# Patient Record
Sex: Female | Born: 1935 | Race: White | Hispanic: No | Smoking: Never smoker
Health system: Southern US, Community
[De-identification: ages and names within clinical notes are randomized; demographics above are authoritative.]

## PROBLEM LIST (undated history)

## (undated) DIAGNOSIS — J189 Pneumonia, unspecified organism: Secondary | ICD-10-CM

## (undated) DIAGNOSIS — K219 Gastro-esophageal reflux disease without esophagitis: Secondary | ICD-10-CM

## (undated) DIAGNOSIS — E785 Hyperlipidemia, unspecified: Secondary | ICD-10-CM

## (undated) HISTORY — DX: Hyperlipidemia, unspecified: E78.5

## (undated) HISTORY — PX: APPENDECTOMY: SHX54

## (undated) HISTORY — DX: Gastro-esophageal reflux disease without esophagitis: K21.9

---

## 1998-02-15 ENCOUNTER — Inpatient Hospital Stay (HOSPITAL_COMMUNITY): Admission: EM | Admit: 1998-02-15 | Discharge: 1998-02-19 | Payer: Self-pay | Admitting: Cardiology

## 2000-06-28 ENCOUNTER — Emergency Department (HOSPITAL_COMMUNITY): Admission: EM | Admit: 2000-06-28 | Discharge: 2000-06-28 | Payer: Self-pay | Admitting: *Deleted

## 2000-06-28 ENCOUNTER — Encounter: Payer: Self-pay | Admitting: *Deleted

## 2001-01-12 ENCOUNTER — Ambulatory Visit (HOSPITAL_COMMUNITY): Admission: RE | Admit: 2001-01-12 | Discharge: 2001-01-12 | Payer: Self-pay | Admitting: Family Medicine

## 2001-01-12 ENCOUNTER — Encounter: Payer: Self-pay | Admitting: Family Medicine

## 2001-06-03 ENCOUNTER — Ambulatory Visit (HOSPITAL_COMMUNITY): Admission: RE | Admit: 2001-06-03 | Discharge: 2001-06-03 | Payer: Self-pay | Admitting: Family Medicine

## 2001-06-03 ENCOUNTER — Encounter: Payer: Self-pay | Admitting: Family Medicine

## 2001-06-30 ENCOUNTER — Ambulatory Visit (HOSPITAL_COMMUNITY): Admission: RE | Admit: 2001-06-30 | Discharge: 2001-06-30 | Payer: Self-pay | Admitting: Internal Medicine

## 2001-12-20 ENCOUNTER — Ambulatory Visit: Admission: RE | Admit: 2001-12-20 | Discharge: 2001-12-20 | Payer: Self-pay | Admitting: Family Medicine

## 2002-01-16 ENCOUNTER — Encounter: Payer: Self-pay | Admitting: Family Medicine

## 2002-01-16 ENCOUNTER — Ambulatory Visit (HOSPITAL_COMMUNITY): Admission: RE | Admit: 2002-01-16 | Discharge: 2002-01-16 | Payer: Self-pay | Admitting: Family Medicine

## 2003-04-10 ENCOUNTER — Ambulatory Visit (HOSPITAL_COMMUNITY): Admission: RE | Admit: 2003-04-10 | Discharge: 2003-04-10 | Payer: Self-pay | Admitting: Family Medicine

## 2004-09-04 ENCOUNTER — Ambulatory Visit (HOSPITAL_COMMUNITY): Admission: RE | Admit: 2004-09-04 | Discharge: 2004-09-04 | Payer: Self-pay | Admitting: Family Medicine

## 2004-10-07 ENCOUNTER — Ambulatory Visit (HOSPITAL_COMMUNITY): Admission: RE | Admit: 2004-10-07 | Discharge: 2004-10-07 | Payer: Self-pay | Admitting: Family Medicine

## 2004-10-16 ENCOUNTER — Ambulatory Visit (HOSPITAL_COMMUNITY): Admission: RE | Admit: 2004-10-16 | Discharge: 2004-10-16 | Payer: Self-pay | Admitting: Family Medicine

## 2005-01-28 ENCOUNTER — Ambulatory Visit (HOSPITAL_COMMUNITY): Admission: RE | Admit: 2005-01-28 | Discharge: 2005-01-28 | Payer: Self-pay | Admitting: Family Medicine

## 2005-02-19 ENCOUNTER — Emergency Department (HOSPITAL_COMMUNITY): Admission: EM | Admit: 2005-02-19 | Discharge: 2005-02-19 | Payer: Self-pay | Admitting: Emergency Medicine

## 2005-03-02 ENCOUNTER — Ambulatory Visit: Payer: Self-pay | Admitting: Orthopedic Surgery

## 2005-04-13 ENCOUNTER — Ambulatory Visit: Payer: Self-pay | Admitting: Orthopedic Surgery

## 2005-04-20 ENCOUNTER — Encounter (HOSPITAL_COMMUNITY): Admission: RE | Admit: 2005-04-20 | Discharge: 2005-05-20 | Payer: Self-pay | Admitting: Orthopedic Surgery

## 2005-09-18 ENCOUNTER — Inpatient Hospital Stay (HOSPITAL_COMMUNITY): Admission: AD | Admit: 2005-09-18 | Discharge: 2005-09-18 | Payer: Self-pay | Admitting: Family Medicine

## 2005-09-24 ENCOUNTER — Ambulatory Visit: Payer: Self-pay | Admitting: Internal Medicine

## 2005-10-01 ENCOUNTER — Ambulatory Visit (HOSPITAL_COMMUNITY): Admission: RE | Admit: 2005-10-01 | Discharge: 2005-10-01 | Payer: Self-pay | Admitting: Internal Medicine

## 2005-10-01 ENCOUNTER — Ambulatory Visit: Payer: Self-pay | Admitting: Internal Medicine

## 2005-10-01 ENCOUNTER — Encounter (INDEPENDENT_AMBULATORY_CARE_PROVIDER_SITE_OTHER): Payer: Self-pay | Admitting: Specialist

## 2005-10-06 ENCOUNTER — Ambulatory Visit (HOSPITAL_COMMUNITY): Admission: RE | Admit: 2005-10-06 | Discharge: 2005-10-06 | Payer: Self-pay | Admitting: Internal Medicine

## 2005-12-11 ENCOUNTER — Ambulatory Visit (HOSPITAL_COMMUNITY): Admission: RE | Admit: 2005-12-11 | Discharge: 2005-12-11 | Payer: Self-pay | Admitting: Family Medicine

## 2006-01-08 ENCOUNTER — Ambulatory Visit (HOSPITAL_COMMUNITY): Admission: RE | Admit: 2006-01-08 | Discharge: 2006-01-08 | Payer: Self-pay | Admitting: Family Medicine

## 2006-01-28 ENCOUNTER — Ambulatory Visit: Payer: Self-pay | Admitting: Internal Medicine

## 2006-04-22 ENCOUNTER — Inpatient Hospital Stay (HOSPITAL_COMMUNITY): Admission: EM | Admit: 2006-04-22 | Discharge: 2006-04-25 | Payer: Self-pay | Admitting: Emergency Medicine

## 2006-04-23 ENCOUNTER — Ambulatory Visit: Payer: Self-pay | Admitting: Cardiology

## 2006-05-13 ENCOUNTER — Encounter (HOSPITAL_COMMUNITY): Admission: RE | Admit: 2006-05-13 | Discharge: 2006-06-12 | Payer: Self-pay | Admitting: Cardiology

## 2006-05-13 ENCOUNTER — Ambulatory Visit: Payer: Self-pay | Admitting: Cardiology

## 2006-05-19 ENCOUNTER — Ambulatory Visit: Payer: Self-pay | Admitting: Cardiology

## 2006-10-26 ENCOUNTER — Ambulatory Visit (HOSPITAL_COMMUNITY): Admission: RE | Admit: 2006-10-26 | Discharge: 2006-10-26 | Payer: Self-pay | Admitting: Family Medicine

## 2006-12-11 ENCOUNTER — Emergency Department (HOSPITAL_COMMUNITY): Admission: EM | Admit: 2006-12-11 | Discharge: 2006-12-11 | Payer: Self-pay | Admitting: Emergency Medicine

## 2006-12-12 ENCOUNTER — Emergency Department (HOSPITAL_COMMUNITY): Admission: EM | Admit: 2006-12-12 | Discharge: 2006-12-12 | Payer: Self-pay | Admitting: Emergency Medicine

## 2008-05-25 ENCOUNTER — Ambulatory Visit (HOSPITAL_COMMUNITY): Admission: RE | Admit: 2008-05-25 | Discharge: 2008-05-25 | Payer: Self-pay | Admitting: Family Medicine

## 2008-05-30 ENCOUNTER — Encounter (HOSPITAL_COMMUNITY): Admission: RE | Admit: 2008-05-30 | Discharge: 2008-06-29 | Payer: Self-pay | Admitting: Family Medicine

## 2008-05-31 ENCOUNTER — Ambulatory Visit (HOSPITAL_COMMUNITY): Admission: RE | Admit: 2008-05-31 | Discharge: 2008-05-31 | Payer: Self-pay | Admitting: Family Medicine

## 2008-05-31 ENCOUNTER — Encounter (INDEPENDENT_AMBULATORY_CARE_PROVIDER_SITE_OTHER): Payer: Self-pay | Admitting: Family Medicine

## 2008-05-31 ENCOUNTER — Ambulatory Visit (HOSPITAL_COMMUNITY): Payer: Self-pay | Admitting: Family Medicine

## 2008-07-20 ENCOUNTER — Emergency Department (HOSPITAL_COMMUNITY): Admission: EM | Admit: 2008-07-20 | Discharge: 2008-07-20 | Payer: Self-pay | Admitting: Emergency Medicine

## 2008-07-27 ENCOUNTER — Encounter (INDEPENDENT_AMBULATORY_CARE_PROVIDER_SITE_OTHER): Payer: Self-pay | Admitting: *Deleted

## 2008-08-10 ENCOUNTER — Ambulatory Visit: Payer: Self-pay | Admitting: Cardiology

## 2008-08-10 ENCOUNTER — Telehealth (INDEPENDENT_AMBULATORY_CARE_PROVIDER_SITE_OTHER): Payer: Self-pay

## 2008-08-10 ENCOUNTER — Encounter: Payer: Self-pay | Admitting: Cardiology

## 2008-08-10 DIAGNOSIS — K219 Gastro-esophageal reflux disease without esophagitis: Secondary | ICD-10-CM

## 2008-08-10 DIAGNOSIS — I10 Essential (primary) hypertension: Secondary | ICD-10-CM | POA: Insufficient documentation

## 2008-08-10 DIAGNOSIS — R079 Chest pain, unspecified: Secondary | ICD-10-CM

## 2008-08-10 DIAGNOSIS — R0602 Shortness of breath: Secondary | ICD-10-CM

## 2008-08-10 DIAGNOSIS — E785 Hyperlipidemia, unspecified: Secondary | ICD-10-CM

## 2008-08-20 ENCOUNTER — Encounter (INDEPENDENT_AMBULATORY_CARE_PROVIDER_SITE_OTHER): Payer: Self-pay | Admitting: *Deleted

## 2008-08-29 DIAGNOSIS — K449 Diaphragmatic hernia without obstruction or gangrene: Secondary | ICD-10-CM | POA: Insufficient documentation

## 2008-08-29 DIAGNOSIS — D649 Anemia, unspecified: Secondary | ICD-10-CM

## 2008-08-29 DIAGNOSIS — K222 Esophageal obstruction: Secondary | ICD-10-CM | POA: Insufficient documentation

## 2008-08-29 DIAGNOSIS — K921 Melena: Secondary | ICD-10-CM

## 2008-08-30 ENCOUNTER — Ambulatory Visit: Payer: Self-pay | Admitting: Internal Medicine

## 2008-08-30 LAB — CONVERTED CEMR LAB
Basophils Absolute: 0.1 10*3/uL (ref 0.0–0.1)
Basophils Relative: 1 % (ref 0–1)
Eosinophils Relative: 1 % (ref 0–5)
HCT: 48.8 % — ABNORMAL HIGH (ref 36.0–46.0)
Lymphs Abs: 1.7 10*3/uL (ref 0.7–4.0)
MCV: 90.9 fL (ref 78.0–100.0)
Monocytes Absolute: 0.7 10*3/uL (ref 0.1–1.0)
Monocytes Relative: 9 % (ref 3–12)
Neutro Abs: 5.5 10*3/uL (ref 1.7–7.7)

## 2008-08-31 ENCOUNTER — Encounter: Payer: Self-pay | Admitting: Internal Medicine

## 2008-08-31 DIAGNOSIS — Z860100 Personal history of colon polyps, unspecified: Secondary | ICD-10-CM | POA: Insufficient documentation

## 2008-09-25 ENCOUNTER — Encounter: Admission: RE | Admit: 2008-09-25 | Discharge: 2008-09-25 | Payer: Self-pay | Admitting: Otolaryngology

## 2009-05-27 ENCOUNTER — Inpatient Hospital Stay (HOSPITAL_COMMUNITY): Admission: EM | Admit: 2009-05-27 | Discharge: 2009-05-29 | Payer: Self-pay | Admitting: Emergency Medicine

## 2009-05-27 ENCOUNTER — Ambulatory Visit: Payer: Self-pay | Admitting: Cardiology

## 2009-07-10 ENCOUNTER — Encounter: Payer: Self-pay | Admitting: Cardiology

## 2009-07-10 ENCOUNTER — Inpatient Hospital Stay (HOSPITAL_COMMUNITY): Admission: EM | Admit: 2009-07-10 | Discharge: 2009-07-13 | Payer: Self-pay | Admitting: Emergency Medicine

## 2009-07-10 ENCOUNTER — Ambulatory Visit: Payer: Self-pay | Admitting: Cardiology

## 2009-07-19 ENCOUNTER — Emergency Department (HOSPITAL_COMMUNITY): Admission: EM | Admit: 2009-07-19 | Discharge: 2009-07-19 | Payer: Self-pay | Admitting: Emergency Medicine

## 2009-07-30 ENCOUNTER — Ambulatory Visit: Payer: Self-pay | Admitting: Cardiovascular Disease

## 2009-07-30 DIAGNOSIS — I2510 Atherosclerotic heart disease of native coronary artery without angina pectoris: Secondary | ICD-10-CM | POA: Insufficient documentation

## 2009-07-31 ENCOUNTER — Encounter: Payer: Self-pay | Admitting: Adult Health

## 2010-01-03 ENCOUNTER — Encounter (INDEPENDENT_AMBULATORY_CARE_PROVIDER_SITE_OTHER): Payer: Self-pay | Admitting: *Deleted

## 2010-02-02 ENCOUNTER — Inpatient Hospital Stay (HOSPITAL_COMMUNITY)
Admission: EM | Admit: 2010-02-02 | Discharge: 2010-02-06 | Payer: Self-pay | Source: Home / Self Care | Attending: Family Medicine | Admitting: Family Medicine

## 2010-02-10 LAB — BASIC METABOLIC PANEL
BUN: 27 mg/dL — ABNORMAL HIGH (ref 6–23)
BUN: 19 mg/dL (ref 6–23)
CO2: 28 mEq/L (ref 19–32)
CO2: 26 mEq/L (ref 19–32)
Calcium: 9 mg/dL (ref 8.4–10.5)
Calcium: 8.8 mg/dL (ref 8.4–10.5)
Chloride: 102 mEq/L (ref 96–112)
Chloride: 106 mEq/L (ref 96–112)
Creatinine, Ser: 1.34 mg/dL — ABNORMAL HIGH (ref 0.4–1.2)
Creatinine, Ser: 1.11 mg/dL (ref 0.4–1.2)
GFR calc Af Amer: 45 mL/min — ABNORMAL LOW (ref >60)
GFR calc Af Amer: 56 mL/min — ABNORMAL LOW (ref >60)
GFR calc non Af Amer: 37 mL/min — ABNORMAL LOW (ref >60)
GFR calc non Af Amer: 46 mL/min — ABNORMAL LOW (ref >60)
Glucose, Bld: 114 mg/dL — ABNORMAL HIGH (ref 70–99)
Glucose, Bld: 89 mg/dL (ref 70–99)
Potassium: 4.3 mEq/L (ref 3.5–5.1)
Potassium: 4.4 mEq/L (ref 3.5–5.1)
Sodium: 139 mEq/L (ref 135–145)
Sodium: 141 mEq/L (ref 135–145)

## 2010-02-10 LAB — URINE CULTURE
Colony Count: NO GROWTH
Culture  Setup Time: 201201100248
Culture: NO GROWTH
Special Requests: NEGATIVE

## 2010-02-10 LAB — CULTURE, RESPIRATORY: Culture: NORMAL

## 2010-02-10 LAB — DIFFERENTIAL
Basophils Absolute: 0 10*3/uL (ref 0.0–0.1)
Basophils Absolute: 0 10*3/uL (ref 0.0–0.1)
Basophils Relative: 0 % (ref 0–1)
Basophils Relative: 0 % (ref 0–1)
Eosinophils Absolute: 0.4 10*3/uL (ref 0.0–0.7)
Eosinophils Absolute: 0.3 10*3/uL (ref 0.0–0.7)
Eosinophils Relative: 2 % (ref 0–5)
Eosinophils Relative: 3 % (ref 0–5)
Lymphocytes Relative: 9 % — ABNORMAL LOW (ref 12–46)
Lymphocytes Relative: 17 % (ref 12–46)
Lymphs Abs: 1.4 10*3/uL (ref 0.7–4.0)
Lymphs Abs: 1.6 10*3/uL (ref 0.7–4.0)
Monocytes Absolute: 0.9 10*3/uL (ref 0.1–1.0)
Monocytes Absolute: 1 10*3/uL (ref 0.1–1.0)
Monocytes Relative: 6 % (ref 3–12)
Monocytes Relative: 10 % (ref 3–12)
Neutro Abs: 13.6 10*3/uL — ABNORMAL HIGH (ref 1.7–7.7)
Neutro Abs: 6.6 10*3/uL (ref 1.7–7.7)
Neutrophils Relative %: 83 % — ABNORMAL HIGH (ref 43–77)
Neutrophils Relative %: 70 % (ref 43–77)

## 2010-02-10 LAB — POCT CARDIAC MARKERS
CKMB, poc: 2 ng/mL (ref 1.0–8.0)
CKMB, poc: 2.4 ng/mL (ref 1.0–8.0)
Myoglobin, poc: 116 ng/mL (ref 12–200)
Myoglobin, poc: 144 ng/mL (ref 12–200)
Troponin i, poc: <0.05 ng/mL (ref 0.00–0.09)
Troponin i, poc: <0.05 ng/mL (ref 0.00–0.09)

## 2010-02-10 LAB — CULTURE, RESPIRATORY W GRAM STAIN

## 2010-02-10 LAB — CBC
HCT: 47.6 % — ABNORMAL HIGH (ref 36.0–46.0)
HCT: 45.6 % (ref 36.0–46.0)
Hemoglobin: 16.2 g/dL — ABNORMAL HIGH (ref 12.0–15.0)
Hemoglobin: 15.4 g/dL — ABNORMAL HIGH (ref 12.0–15.0)
MCH: 31.8 pg (ref 26.0–34.0)
MCH: 31.5 pg (ref 26.0–34.0)
MCHC: 34 g/dL (ref 30.0–36.0)
MCHC: 33.8 g/dL (ref 30.0–36.0)
MCV: 93.5 fL (ref 78.0–100.0)
MCV: 93.3 fL (ref 78.0–100.0)
Platelets: 258 10*3/uL (ref 150–400)
Platelets: 231 10*3/uL (ref 150–400)
RBC: 5.09 MIL/uL (ref 3.87–5.11)
RBC: 4.89 MIL/uL (ref 3.87–5.11)
RDW: 12.6 % (ref 11.5–15.5)
RDW: 13 % (ref 11.5–15.5)
WBC: 16.3 10*3/uL — ABNORMAL HIGH (ref 4.0–10.5)
WBC: 9.5 10*3/uL (ref 4.0–10.5)

## 2010-02-10 LAB — BRAIN NATRIURETIC PEPTIDE: Pro B Natriuretic peptide (BNP): 48.3 pg/mL (ref 0.0–100.0)

## 2010-02-14 NOTE — Discharge Summary (Signed)
  [name], [name]              ACCOUNT NO.:  [id number]  MEDICAL RECORD NO.:  [id number]          PATIENT TYPE:  INP  LOCATION:  A315                          FACILITY:  [facility]  PHYSICIAN:  Mila Homer. Sudie Bailey, M.D.DATE OF BIRTH:  February 14, 1915  DATE OF ADMISSION:  02/02/2010 DATE OF DISCHARGE:  01/12/2012LH                              DISCHARGE SUMMARY   HISTORY OF PRESENT ILLNESS:  This 77 year old woman was admitted to the hospital with pneumonia.  She __________ five-day hospitalization extending from February 02, 2010, to February 06, 2010.  Vital signs remained stable.  Admission white cell count was 15,300 with 83% neutrophils.  The BMP showed a glucose of 114, BUN 27, and creatinine 1.34.  Recheck of the white cell count several days later was 9500 with a normal differential, and BMP showed BUN of 19 and creatinine 1.11.  Cardiac markers were normal.  BNP was 48.  Urine culture showed no growth and respiratory culture was felt to just show normal oropharyngeal flora.  Chest x-ray showed no acute cardiopulmonary abnormality and a persistent large diaphragmatic hernia.  This limited full evaluation of the left lower lobe.  She was admitted to the hospital by Dr. Ouida Sills who was on-call for me. She is put on normal saline, KVO, Rocephin 1 gram IV q.24 hours, Zithromax 500 mg IV q.24 hours, ASA 81 mg daily, bumetanide 0.5 mg every other day, lisinopril 20 mg daily, Norvasc 10 mg daily, Plavix 75 mg daily, alprazolam 0.25 mg b.i.d., and Lovenox 30 mg subcu q.24 hours.  When her renal function improved, Lovenox was increased to 40 mg subcu q.24 hours.  She is given Mucinex b.i.d. and Vicodin 5/500 for pains.  By her fourth day, the IV was discontinued to switch to a Hep-Lock.  O2 was checked on room air and it was discovered that her O2 sat was only 87% on room air.  She did well with the O2.  Arrangements were made for home health to follow her at home to check her O2 sat and  her convalescence.  She is much improved at time of discharge home on her fifth day.  FINAL DISCHARGE DIAGNOSES: 1. Presumptive pneumonia. 2. Benign essential hypertension. 3. Severe degenerative joint disease in the knees. 4. Dehydration.  HOME MEDICATIONS: 1. Ceftin 500 mg b.i.d. (20, no refills). 2. Alprazolam 0.5 mg, half tablet b.i.d. 3. Amlodipine 10 mg daily. 4. ASA 81 mg daily. 5. Clopidogrel 75 mg daily. 6. Iron OTC daily. 7. Lisinopril 10 mg daily.  FOLLOWUP:  Follow-up is to be in the office in the week following discharge.     Mila Homer. Sudie Bailey, M.D.     SDK/MEDQ  D:  02/06/2010  T:  02/06/2010  Job:  [id number]  Electronically Signed by John Giovanni M.D. on 02/14/2010 07:09:16 AM

## 2010-02-14 NOTE — Progress Notes (Signed)
  NAMEDECARLA, SIEMEN              ACCOUNT NO.:  [id number]  MEDICAL RECORD NO.:  [id number]          PATIENT TYPE:  INP  LOCATION:  A315                          FACILITY:  [facility]  PHYSICIAN:  Mila Homer. Sudie Bailey, M.D.DATE OF BIRTH:  04/10/1915  DATE OF PROCEDURE: DATE OF DISCHARGE:                                PROGRESS NOTE   SUBJECTIVE:  She is feeling better today.  OBJECTIVE:  She is up in bed, sitting up.  Color is good on O2. Temperature is 97.6, pulse 97, respiratory rate 18, blood pressure 135/82. She is well-developed, well-nourished.  No acute distress, oriented and alert today. LUNGS:  Show occasional inspiratory and expiratory rhonchi throughout, but she is moving air well.  There are no intercostal retractions.  No use of accessory muscles of respiration. HEART:  Has a regular rhythm, rate about 90.  There is no edema of the ankles. O2 saturations 94% on 2 liters.  ASSESSMENT: 1. Pneumonia. 2. Benign essential hypertension.  PLAN:  I talked to discharge planning and the patient's personal nurse. Will get physical therapy to make sure that she can do transfers and ambulation with assistance today.  DC the IV switch to Hep-Lock. Continue IV antibiotics.  Arrange for home health if needed and plan for discharge tomorrow.     Mila Homer. Sudie Bailey, M.D.     SDK/MEDQ  D:  02/05/2010  T:  02/05/2010  Job:  [id number]  Electronically Signed by John Giovanni M.D. on 02/14/2010 07:09:31 AM

## 2010-02-14 NOTE — Progress Notes (Signed)
  NAMEHARLEE, [name]              ACCOUNT NO.:  [id number]  MEDICAL RECORD NO.:  [id number]          PATIENT TYPE:  INP  LOCATION:  A315                          FACILITY:  [facility]  PHYSICIAN:  Mila Homer. Sudie Bailey, M.D.DATE OF BIRTH:  03/21/1915  DATE OF PROCEDURE:  02/04/2010 DATE OF DISCHARGE:                                PROGRESS NOTE   SUBJECTIVE:  Cough is somewhat better but she is having some abdominal back pain from all the coughing she did yesterday.  Nursing feels she is improved, is actually coughing up some sputum now.  OBJECTIVE:  VITAL SIGNS:  Temperature is 98.2, pulse 83, respiratory rate 17, blood pressure 108/67. GENERAL:  She is somewhat recumbent in bed.  Her color is good on O2. She is in no acute distress, well-developed and obese. LUNGS:  Decreased breath sounds throughout but she is moving air well without intercostal retraction, without use of accessory muscles for respiration. HEART:  A regular rhythm, rate of 80. ABDOMEN:  Soft without organomegaly or mass, was mildly tender. EXTREMITIES:  There is no edema of the ankles.  LABORATORY DATA:  White cell count today is 9500 which is dropped from admission count 16,000.  She has 70% neutrophils.  Hemoglobin 15.4 and BMP is normal.  ASSESSMENT: 1. Presumptive pneumonia. 2. Benign essential hypertension.  PLAN:  Add Vicodin 5/500 q.4 h for pain.  Add Mucinex t.i.d.  Continue IV Rocephin and Zithromax.  She should be ready for discharge in 1-2 days.     Mila Homer. Sudie Bailey, M.D.     SDK/MEDQ  D:  02/04/2010  T:  02/04/2010  Job:  [id number]  Electronically Signed by John Giovanni M.D. on 02/14/2010 04:54:09 AM

## 2010-02-14 NOTE — Progress Notes (Signed)
  [name], [name]              ACCOUNT NO.:  [id number]  MEDICAL RECORD NO.:  [id number]          PATIENT TYPE:  INP  LOCATION:  A315                          FACILITY:  [facility]  PHYSICIAN:  Mila Homer. Sudie Bailey, M.D.DATE OF BIRTH:  [birth year]  DATE OF PROCEDURE:  02/03/2010 DATE OF DISCHARGE:                                PROGRESS NOTE   SUBJECTIVE:  The patient was admitted to the hospital last night after having symptoms of a "bad chest cold" for a day.  She had been coughing up thick yellow sputum.  Nursing has also noted she has a strong odor to her urine.  OBJECTIVE:  VITAL SIGNS:  Temperature is 97.5, pulse 85, respiratory rate 16, blood pressure 156/82.  GENERAL:  She is sitting up in bed with O2 on at 2 liters.  Color is good.  She is well-developed, well- nourished, oriented and alert.  Her speech is normal. LUNGS:  Clear throughout moving air well without intercostal retraction without use of accessory muscles of respiration. HEART:  Regular rhythm rate of about 80 with a 1/6 SEM. EXTREMITIES:  There is trace to 1+ pitting edema of the distal legs and ankles.  LABORATORY DATA:  Admission WBC was 16,300 with a shift to the left, BUN 27, creatinine 1.34.  ASSESSMENT: 1. Presumptive pneumonia 2. Benign essential hypertension. 3. Severe degenerative joint disease in knees. 4. Dehydration. 5. Question urinary tract infection.  PLAN: 1. Sputum for gram stain C and S, urine for UA and C and S.  Repeat a     CBC and BMP in the a.m. 2. Continue O2 and IV antibiotics. 3. Currently on Zithromax 500 mg IV q.24 h. 4. Rocephin 1 gm IV q.24 h. 5. Continue amlodipine 10 mg daily. 6. Bumetanide 0.5 mg daily. 7. Plavix 75 mg daily. 8. Lisinopril 20 mg daily. 9. Continue ASA 81 mg daily. 10.She also has alprazolam 0.25 mg b.i.d.  I suspect it will be 3-4 days before she is ready for discharge home given age and constitution.     Mila Homer. Sudie Bailey,  M.D.     SDK/MEDQ  D:  02/03/2010  T:  02/03/2010  Job:  [id number]  Electronically Signed by John Giovanni M.D. on 02/14/2010 62:83:15 AM

## 2010-02-16 ENCOUNTER — Encounter: Payer: Self-pay | Admitting: Cardiology

## 2010-02-16 ENCOUNTER — Encounter: Payer: Self-pay | Admitting: Family Medicine

## 2010-02-25 NOTE — Consult Note (Signed)
Summary: [facility] Consult-Dr. Diona Browner  [facility]   Imported By: Roderic Ovens 08/10/2009 12:03:01  _____________________________________________________________________  External Attachment:    Type:   Image     Comment:   External Document

## 2010-02-25 NOTE — Assessment & Plan Note (Signed)
Summary: [facility]   Visit Type:  Follow-up Referring Provider:  Sudie Bailey Primary Provider:  Dr.Stephen Sudie Bailey  CC:  no cardiology complaints.  History of Present Illness: [name] is a very pleasant 70 CF who was recently in [facility] with a NSTEMI.  She refused invasivre cardiac testing and elected to be treated medically only.  She was also diagnosed with grade II diastolic dysfunction, with EF of 55%-60%.  She has a history of HTN snf mild anxiety. She uses a wheel chair for ambulation and is very sedintary at home.  She denies any further complaints of chest discomfort, Dyspena or weakness.  She is medically compliant.  It is noted that on discharge she was started on Plavix but she does not have a Rx for this and has not been taking it. She states she was taking Plavix while in the hospital.  Current Medications (verified): 1)  Bumetanide 1 Mg Tabs (Bumetanide) .Marland Kitchen.. 1 Tab Once Daily 2)  Klor-Con M20 20 Meq Cr-Tabs (Potassium Chloride Crys Cr) .Marland Kitchen.. 1 Tab Once Daily 3)  Alprazolam 0.5 Mg Tabs (Alprazolam) .Marland Kitchen.. 1 Tab Two Times A Day 4)  Amlodipine Besylate 10 Mg Tabs (Amlodipine Besylate) .Marland Kitchen.. 1 Tab Once Daily 5)  Oscal 500/200 D-3 500-200 Mg-Unit Tabs (Calcium-Vitamin D) .Marland Kitchen.. 1 Tab Once Daily 6)  Multivitamins  Tabs (Multiple Vitamin) .... Once Daily 7)  Iron 325 (65 Fe) Mg Tabs (Ferrous Sulfate) .... As Directed 8)  Aleve 220 Mg Tabs (Naproxen Sodium) .... Use As Directed 9)  Aspir-Low 81 Mg Tbec (Aspirin) .... Take 1 Tab Daily 10)  Colcrys 0.6 Mg Tabs (Colchicine) .... Take 1 Tab Three Times A Day 11)  Lisinopril 20 Mg Tabs (Lisinopril) .... Take 1 Tab Daily 12)  Vitamin C Cr 500 Mg Cr-Caps (Ascorbic Acid) .... Take 1 Tab Daily 13)  Milk of Magnesia 400 Mg/55ml Susp (Magnesium Hydroxide) .... Use As Needed 14)  Plavix 75 Mg Tabs (Clopidogrel Bisulfate) .... Take 1 Tablet By Mouth Once A Day  Allergies (verified): No Known Drug Allergies  Past History:  Past medical, surgical, family and  social histories (including risk factors) reviewed, and no changes noted (except as noted below).  Past Medical History: Reviewed history from 08/10/2008 and no changes required. HYPERLIPIDEMIA, MILD (ICD-272.4) GASTROESOPHAGEAL REFLUX DISEASE (ICD-530.81) ESSENTIAL HYPERTENSION (ICD-401.9) CHEST PAIN (ICD-786.50)  Past Surgical History: Reviewed history from 08/08/2008 and no changes required. appendectomy  Family History: Reviewed history from 08/30/2008 and no changes required. Father:deceased due to heart issues age 48 Mother:deceased age 57 due to brain cancer Siblings one brother and one sister ( both deceased) Brother had MI,unsure about sister  Social History: Reviewed history from 08/30/2008 and no changes required. widow Tobacco Use - No.  Alcohol Use - no Regular Exercise - no Drug Use - no pt has 7 children Marital Status: Married Children:  Occupation:   Review of Horticulturist, commercial. All other systems have been reviewed and are negative unless stated above.   Vital Signs:  Patient profile:   77 year old female Weight:      188 pounds BMI:     31.40 Pulse rate:   88 / minute BP sitting:   119 / 66  (right arm)  Vitals Entered By: Dreama Saa, CNA (July 30, 2009 2:48 PM)  Physical Exam  General:  normal appearance.   Lungs:  Diminished bibasilar.  No wheezes  or rhonci. Heart:  RRR with soft systolic murmur Abdomen:  Bowel sounds positive; abdomen soft and non-tender without masses, organomegaly, or hernias noted. No hepatosplenomegaly. Msk:  Kyphosis Extremities:  trace left pedal edema and trace right pedal edema.in dependent position   Neurologic:  Alert and oriented x 3. Psych:  Normal affect.   Impression & Recommendations:  Problem # 1:  CAD, NATIVE VESSEL (ICD-414.01) [name] is without complaint at this time.  She continues to prefer med mgt.  I will restart Plavix 75mg  daily as she was Rx this on discharge but no Rx was  provided.  She is sedintary and uses a wheel chair for ambulation.  She will return in 3 months or sooner should she be symptomatic. Her updated medication list for this problem includes:    Amlodipine Besylate 10 Mg Tabs (Amlodipine besylate) .Marland Kitchen... 1 tab once daily    Aspir-low 81 Mg Tbec (Aspirin) .Marland Kitchen... Take 1 tab daily    Lisinopril 20 Mg Tabs (Lisinopril) .Marland Kitchen... Take 1 tab daily    Plavix 75 Mg Tabs (Clopidogrel bisulfate) .Marland Kitchen... Take 1 tablet by mouth once a day  Problem # 2:  HYPERTENSION (ICD-401.9) Assessment: Unchanged  The following medications were removed from the medication list:    Hydrochlorothiazide 25 Mg Tabs (Hydrochlorothiazide) ..... Once daily Her updated medication list for this problem includes:    Bumetanide 1 Mg Tabs (Bumetanide) .Marland Kitchen... 1 tab once daily    Amlodipine Besylate 10 Mg Tabs (Amlodipine besylate) .Marland Kitchen... 1 tab once daily    Aspir-low 81 Mg Tbec (Aspirin) .Marland Kitchen... Take 1 tab daily    Lisinopril 20 Mg Tabs (Lisinopril) .Marland Kitchen... Take 1 tab daily  Patient Instructions: 1)  RESTART YOUR PLAVIX 75MG  TAKING ONE DAILY.  A new prescription has been sent to your pharmacy. 2)  Your physician wants you to follow-up in:3 MONTHS. You will receive a reminder letter in the mail about two months in advance. If you don't receive a letter, please call our office to schedule the follow-up appointment. Prescriptions: PLAVIX 75 MG TABS (CLOPIDOGREL BISULFATE) Take 1 tablet by mouth once a day  #30 x 6   Entered by:   Carlye Grippe   Authorized by:   Joni Reining, NP   Signed by:   Carlye Grippe on 07/30/2009   Method used:   Electronically to        [facility] (retail)       [address]       [place], [place]  [address]       Ph: [phone] or [phone]       Fax: [phone]   RxID:   [id number]   Prevention & Chronic Care Immunizations   Influenza vaccine: Not documented    Tetanus booster: Not documented    Pneumococcal  vaccine: Not documented    H. zoster vaccine: Not documented  Colorectal Screening   Hemoccult: Not documented    Colonoscopy: Not documented  Other Screening   Pap smear: Not documented    Mammogram: Not documented    DXA bone density scan: Not documented   Smoking status: never  (08/08/2008)  Lipids   Total Cholesterol: Not documented   LDL: Not documented   LDL Direct: Not documented   HDL: Not documented   Triglycerides: Not documented    SGOT (AST): Not documented   SGPT (ALT): Not documented   Alkaline phosphatase: Not documented   Total bilirubin: Not documented  Hypertension   Last Blood Pressure: 119 / 66  (07/30/2009)  Serum creatinine: Not documented   Serum potassium Not documented  Self-Management Support :    Hypertension self-management support: Not documented    Lipid self-management support: Not documented

## 2010-02-25 NOTE — Letter (Signed)
Summary: Appointment - Reminder 2  [facility] [address]   [place], [place] [address]   Phone: [phone]  Fax: [phone]     January 03, 2010 MRN: [id number]   [name] [address] [place], [place]  [address]   Dear [name],  Our records indicate that it is time to schedule a follow-up appointment.  Dr.    Dietrich Pates      recommended that you follow up with [place] in   10.2011 PAST DUE         . It is very important that we reach you to schedule this appointment. We look forward to participating in your health care needs. Please contact us at the number listed above at your earliest convenience to schedule your appointment.  If you are unable to make an appointment at this time, give [place] a call so we can update our records.     Sincerely,   Glass blower/designer

## 2010-04-13 LAB — CBC
HCT: 38.8 % (ref 36.0–46.0)
HCT: 40 % (ref 36.0–46.0)
Hemoglobin: 13.3 g/dL (ref 12.0–15.0)
Hemoglobin: 13.3 g/dL (ref 12.0–15.0)
MCV: 89.7 fL (ref 78.0–100.0)
MCV: 90.2 fL (ref 78.0–100.0)
Platelets: 247 10*3/uL (ref 150–400)
Platelets: 249 10*3/uL (ref 150–400)
RBC: 4.32 MIL/uL (ref 3.87–5.11)
WBC: 7.5 10*3/uL (ref 4.0–10.5)
WBC: 8.8 10*3/uL (ref 4.0–10.5)

## 2010-04-13 LAB — RENAL FUNCTION PANEL
BUN: 13 mg/dL (ref 6–23)
CO2: 27 mEq/L (ref 19–32)
Calcium: 8.3 mg/dL — ABNORMAL LOW (ref 8.4–10.5)
Glucose, Bld: 102 mg/dL — ABNORMAL HIGH (ref 70–99)
Sodium: 138 mEq/L (ref 135–145)

## 2010-04-13 LAB — POCT CARDIAC MARKERS
Myoglobin, poc: 162 ng/mL (ref 12–200)
Troponin i, poc: <0.05 ng/mL (ref 0.00–0.09)

## 2010-04-13 LAB — DIFFERENTIAL
Eosinophils Absolute: 0.1 10*3/uL (ref 0.0–0.7)
Eosinophils Relative: 1 % (ref 0–5)
Eosinophils Relative: 2 % (ref 0–5)
Lymphocytes Relative: 15 % (ref 12–46)
Lymphs Abs: 1.5 10*3/uL (ref 0.7–4.0)
Monocytes Absolute: 0.9 10*3/uL (ref 0.1–1.0)
Monocytes Relative: 10 % (ref 3–12)
Neutro Abs: 8.4 10*3/uL — ABNORMAL HIGH (ref 1.7–7.7)

## 2010-04-13 LAB — BASIC METABOLIC PANEL
Calcium: 8.3 mg/dL — ABNORMAL LOW (ref 8.4–10.5)
Chloride: 102 mEq/L (ref 96–112)
Chloride: 105 mEq/L (ref 96–112)
Creatinine, Ser: 1.39 mg/dL — ABNORMAL HIGH (ref 0.4–1.2)
GFR calc Af Amer: 43 mL/min — ABNORMAL LOW (ref >60)
GFR calc Af Amer: 60 mL/min — ABNORMAL LOW (ref >60)
Potassium: 3.9 mEq/L (ref 3.5–5.1)
Potassium: 4 mEq/L (ref 3.5–5.1)
Sodium: 139 mEq/L (ref 135–145)

## 2010-04-13 LAB — PROTIME-INR: INR: 0.91 (ref 0.00–1.49)

## 2010-04-13 LAB — CARDIAC PANEL(CRET KIN+CKTOT+MB+TROPI)
CK, MB: 2.6 ng/mL (ref 0.3–4.0)
Relative Index: 27 — ABNORMAL HIGH (ref 0.0–2.5)
Troponin I: 10.67 ng/mL (ref 0.00–0.06)

## 2010-04-14 LAB — DIFFERENTIAL
Basophils Absolute: 0 10*3/uL (ref 0.0–0.1)
Basophils Relative: 0 % (ref 0–1)
Monocytes Relative: 5 % (ref 3–12)
Neutro Abs: 6.3 10*3/uL (ref 1.7–7.7)
Neutrophils Relative %: 76 % (ref 43–77)

## 2010-04-14 LAB — CARDIAC PANEL(CRET KIN+CKTOT+MB+TROPI)
CK, MB: 162.6 ng/mL (ref 0.3–4.0)
Relative Index: 31.4 — ABNORMAL HIGH (ref 0.0–2.5)
Total CK: 258 U/L — ABNORMAL HIGH (ref 7–177)
Total CK: 518 U/L — ABNORMAL HIGH (ref 7–177)

## 2010-04-14 LAB — CBC
MCHC: 33.2 g/dL (ref 30.0–36.0)
Platelets: 291 10*3/uL (ref 150–400)
RBC: 5.06 MIL/uL (ref 3.87–5.11)

## 2010-04-14 LAB — POCT CARDIAC MARKERS
CKMB, poc: 2.3 ng/mL (ref 1.0–8.0)
Myoglobin, poc: 115 ng/mL (ref 12–200)

## 2010-04-14 LAB — BASIC METABOLIC PANEL
CO2: 29 mEq/L (ref 19–32)
Calcium: 8.7 mg/dL (ref 8.4–10.5)
Creatinine, Ser: 1.3 mg/dL — ABNORMAL HIGH (ref 0.4–1.2)
GFR calc Af Amer: 46 mL/min — ABNORMAL LOW (ref >60)

## 2010-04-15 LAB — POCT CARDIAC MARKERS
CKMB, poc: 1.7 ng/mL (ref 1.0–8.0)
Myoglobin, poc: 100 ng/mL (ref 12–200)

## 2010-04-15 LAB — DIFFERENTIAL
Lymphocytes Relative: 23 % (ref 12–46)
Monocytes Absolute: 0.4 10*3/uL (ref 0.1–1.0)
Monocytes Relative: 7 % (ref 3–12)
Neutro Abs: 4 10*3/uL (ref 1.7–7.7)
Neutrophils Relative %: 66 % (ref 43–77)

## 2010-04-15 LAB — TSH: TSH: 2.425 u[IU]/mL (ref 0.350–4.500)

## 2010-04-15 LAB — CBC
Hemoglobin: 15.2 g/dL — ABNORMAL HIGH (ref 12.0–15.0)
RBC: 4.92 MIL/uL (ref 3.87–5.11)
WBC: 6.1 10*3/uL (ref 4.0–10.5)

## 2010-04-15 LAB — BASIC METABOLIC PANEL
Calcium: 8.9 mg/dL (ref 8.4–10.5)
GFR calc Af Amer: 56 mL/min — ABNORMAL LOW (ref >60)
GFR calc non Af Amer: 46 mL/min — ABNORMAL LOW (ref >60)
Sodium: 140 mEq/L (ref 135–145)

## 2010-04-15 LAB — CARDIAC PANEL(CRET KIN+CKTOT+MB+TROPI)
CK, MB: 1.6 ng/mL (ref 0.3–4.0)
Total CK: 27 U/L (ref 7–177)
Troponin I: 0.01 ng/mL (ref 0.00–0.06)
Troponin I: 0.01 ng/mL (ref 0.00–0.06)

## 2010-04-15 LAB — BRAIN NATRIURETIC PEPTIDE: Pro B Natriuretic peptide (BNP): <30 pg/mL (ref 0.0–100.0)

## 2010-05-05 LAB — DIFFERENTIAL
Basophils Relative: 1 % (ref 0–1)
Eosinophils Absolute: 0.1 10*3/uL (ref 0.0–0.7)
Monocytes Absolute: 0.6 10*3/uL (ref 0.1–1.0)
Neutro Abs: 5 10*3/uL (ref 1.7–7.7)
Neutrophils Relative %: 68 % (ref 43–77)

## 2010-05-05 LAB — URINALYSIS, ROUTINE W REFLEX MICROSCOPIC
Bilirubin Urine: NEGATIVE
Glucose, UA: NEGATIVE mg/dL
Hgb urine dipstick: NEGATIVE
Ketones, ur: NEGATIVE mg/dL
Protein, ur: NEGATIVE mg/dL

## 2010-05-05 LAB — BASIC METABOLIC PANEL
CO2: 34 mEq/L — ABNORMAL HIGH (ref 19–32)
Chloride: 94 mEq/L — ABNORMAL LOW (ref 96–112)
Creatinine, Ser: 1.19 mg/dL (ref 0.4–1.2)
GFR calc Af Amer: 51 mL/min — ABNORMAL LOW (ref >60)
Glucose, Bld: 87 mg/dL (ref 70–99)

## 2010-05-05 LAB — CBC
Hemoglobin: 13.8 g/dL (ref 12.0–15.0)
MCHC: 33.3 g/dL (ref 30.0–36.0)
MCV: 83.6 fL (ref 78.0–100.0)
RBC: 4.98 MIL/uL (ref 3.87–5.11)

## 2010-05-06 LAB — CROSSMATCH: Antibody Screen: NEGATIVE

## 2010-05-06 LAB — HEMOGLOBIN AND HEMATOCRIT, BLOOD
HCT: 27 % — ABNORMAL LOW (ref 36.0–46.0)
Hemoglobin: 8.4 g/dL — ABNORMAL LOW (ref 12.0–15.0)

## 2010-05-16 ENCOUNTER — Other Ambulatory Visit: Payer: Self-pay

## 2010-05-16 MED ORDER — CLOPIDOGREL BISULFATE 75 MG PO TABS: 75.0000 mg | ORAL_TABLET | Freq: Every day | ORAL | Status: DC

## 2010-06-13 NOTE — Letter (Signed)
May 19, 2006    [name]. [name], M.D.  [address]  [place], [place] [address]   RE:  [name], STONES  MRN:  [id number]  /  DOB:  01-20-1916   Dear Brett Canales,   [name] returns to the office after an eight-year hiatus.  As you  know, she was recently admitted to hospital and evaluated by Dr.  Jens Som for chest discomfort.  She has a history of GERD, which was  thought to possibly represent the etiology for her symptoms.  She  underwent cardiac catheterization in January of 2000, revealing  insignificant disease in the first and second diagonals and distal LAD.  She has felt well since hospital discharge.   A pharmacologic stress nuclear study was performed a week ago.  This  study revealed normal left ventricular size and normal left ventricular  systolic function.  There was an apparent perfusion defect  inferolaterally and apically, thought to represent breast attenuation  artifact.  Perfusion was otherwise normal.   CURRENT MEDICATIONS INCLUDE:  1. Amlodipine 10 mg daily.  2. Vytorin 10/40 mg daily.  3. Pantoprazole 40 mg daily.  4. Aspirin 81 mg daily.  5. Os-Cal daily.  6. Alprazolam 0.5 mg p.r.n.   ON EXAM:  A very pleasant, overweight [age]ogenarian.  The weight is 209,  two pounds more than in April of 1999.  Blood pressure 135/85, heart  rate 90 and regular, respirations 16.  NECK:  No jugular venous distention; no carotid bruits.  LUNGS:  Decreased breath sounds at the bases; otherwise clear.  CARDIAC:  Normal first and second heart sounds; fourth heart sound  present.  EXTREMITIES:  Trace edema.   IMPRESSION:  [name] continues to have no evidence for cardiovascular  disease.  Hypertension and dyslipidemia appear to be adequately managed.  Please let me know at any time that I can offer further assistance in  the care of this very nice woman.    Sincerely,      Gerrit Friends. Dietrich Pates, MD, [facility] [place]  Electronically Signed    RMR/MedQ  DD: 05/19/2006   DT: 05/19/2006  Job #: [id number]

## 2010-06-13 NOTE — Consult Note (Signed)
NAMEJADIN, KAGEL              ACCOUNT NO.:  [id number]   MEDICAL RECORD NO.:  [id number]          PATIENT TYPE:  OBV   LOCATION:  A330                          FACILITY:  [facility]   PHYSICIAN:  R. Roetta Sessions, M.D. DATE OF BIRTH:  1915-01-31   DATE OF CONSULTATION:  09/17/2005  DATE OF DISCHARGE:                                   CONSULTATION   REASON FOR CONSULTATION:  Profound microcytic anemia.   HISTORY OF PRESENT ILLNESS:  Ms. [name] is a pleasant 77 year old  Caucasian female followed primarily by Dr. Gareth Morgan who was admitted  for observation by Dr. Renard Matter after determining that she had a profound  anemia with a hemoglobin of 5.9 and hematocrit 19.6, MCV 67.2, platelet  count 456.  She was hemoccult negative in Dr. Renard Matter' office.  She has not  had any melena or hematochezia.  She does have occasional reflux symptoms in  the setting of a large hiatal hernia.  No odynophagia, no dysphagia.  No  abdominal pain.  No hematemesis.  No weight loss.  Her appetite has been  well maintained, she has not lost any weight.  She has never had her lower  GI tract imaged, there is no family history of colorectal neoplasia.  She  underwent an evaluation by me via EGD back in 2003 for possible  gastroesophageal reflux disease with some ENT symptoms.  She was found to  have a foreshortened esophagus with a massive hiatal hernia with 2/3 of her  stomach above the diaphragmatic hiatus.  She was treated with twice a day  Nexium 4 mg.  We have recommended an ENT evaluation, but the patient and  family do not remember ever having this done.   PAST MEDICAL HISTORY:  Significant of hyperlipidemia and hypertension.  She  denies any history of coronary disease or diabetes.   PAST SURGICAL HISTORY:  Appendectomy when she was 77 years old, EGD in 2003  as outlined above, history of right wrist fracture secondary to fall treated  by Dr. Romeo Apple at the first of this year.   CURRENT  MEDICATIONS:  1. Alprazolam 0.5 mg p.o. b.i.d.  2. Klor-Con 10 mEq 2 tablets daily.  3. Vytorin 10/40 once daily.  4. HCTZ 25 mg daily.  5. Amlodipine 10 mg daily.  6. Furosemide 40 mg daily.   ALLERGIES:  None.   FAMILY HISTORY:  Negative for chronic GI or liver disease, particularly  negative for colorectal neoplasia.   SOCIAL HISTORY:  The patient is a resident of [place].  She has seven  children (seven vaginal deliveries).  No history of tobacco or alcohol, no  illicit drugs.   REVIEW OF SYSTEMS:  No chest pain.  She has had dyspnea and easy fatigue.  No fevers or chills.  No weight loss.  Otherwise, as outlined in history of  present illness.   PHYSICAL EXAMINATION:  GENERAL:  Pleasant elderly, alert, conversant 77 year old lady accompanied  by multiple family members.  VITAL SIGNS:  Temperature 97.2, pulse 90, respirations 22, blood pressure  139/69.  SKIN:  Warm and dry.  HEENT:  Conjunctivae are pale, no scleral icterus.  Oral cavity with no  lesions, she has dentures.  CHEST:  Lungs are clear to auscultation.  CARDIAC:  Regular rate and rhythm, occasional ectopic beat.  BREASTS:  Deferred.  ABDOMEN:  Obese, positive bowel sounds, soft, nontender, no appreciable  hepatosplenomegaly.  EXTREMITIES:  No edema.  RECTAL:  Exam done earlier today by Dr. Renard Matter, she was heme occult  negative in his office.   LABORATORY DATA:  White count 7.2, hemoglobin 5.9, hematocrit 19.6, MCV  67.2, platelet count 456, reticulocyte count 5.3.  Potassium 5, chloride  102, CO2 28, glucose 90, BUN 16, creatinine 1.  Albumin 0.8, alkaline phos  51, AST 17, ALT 12, albumin 3.3, calcium 8.4.   IMPRESSION:  Ms. [name] is a pleasant 77 year old lady admitted to  observation with a profound microcytic anemia.  She is slated to have  transfusion with packed red blood cells.  Aside from some occasional reflux  symptoms, she is devoid of any lower GI tract symptoms.  She is  hemoccult  negative.  Iron studies are pending.  She does take occasional non-  steroidals.  I am somewhat concerned that she has never had her lower GI  tract imaged.  She has a history of a large hiatal hernia.   RECOMMENDATIONS:  I fully agree with transfusion with packed red blood  cells, this is a high priority at this point in time.  She needs to have  diagnostic colonoscopy and possibly EGD.  As mentioned above, she is on a  regular diet.  I think we can go ahead and plan to get her transfused to a  reasonable H&H over the next 24 hours, allow her to go home, and bring her  back at the first of the week for an outpatient colonoscopy and possible  EGD.  I have asked her to refrain from taking all forms of non-steroidals  including aspirin until we get the etiology of the anemia sorted out.  I  will also add PPI in the way of Protonix 40 mg orally daily for her  gastroesophageal reflux disease/empirically.  I will make arrangements to  get an expedited outpatient endoscopic evaluation planned for the first of  next week.   I would like to thank Dr. Butch Penny for allowing me to see this nice  lady once again today.      Jonathon Bellows, M.D.  Electronically Signed     RMR/MEDQ  D:  09/17/2005  T:  09/17/2005  Job:  [id number]   cc:   Angus G. Renard Matter, MD  Fax: [phone]   Mila Homer. Sudie Bailey, M.D.  Fax: [phone]

## 2010-06-13 NOTE — Group Therapy Note (Signed)
NAMELIND, AUSLEY              ACCOUNT NO.:  [id number]   MEDICAL RECORD NO.:  [id number]          PATIENT TYPE:  INP   LOCATION:  A330                          FACILITY:  [facility]   PHYSICIAN:  Mila Homer. Sudie Bailey, M.D.DATE OF BIRTH:  07-06-1915   DATE OF PROCEDURE:  09/17/2005  DATE OF DISCHARGE:                                   PROGRESS NOTE   SUBJECTIVE:  This 77 year old woman was admitted yesterday severely anemic  with a hemoglobin 5.9.  She had two transfusions of packed rbc's last night  and this morning has been somewhat short of breath, a little chest tightness  and a cough which is nonproductive.   She tells me she has been taking aspirin and Naprosyn at home.  She denies  hematochezia or melena or abdominal pain.   OBJECTIVE:  GENERAL:  She is sitting up in bed eating breakfast currently.  Color is good.  VITAL SIGNS:  Temperature is 98 degrees, pulse 86, respiratory rate 16,  blood pressure 130/59.  HEART:  Regular rhythm, rate of 90.  No murmurs.  LUNGS:  Bibasilar rales, but otherwise are clear throughout.  She is moving  air well.  No intercostal retraction.  No use of accessory muscles of  respiration.  ABDOMEN:  Soft without tenderness.  She has 1-2+ pitting edema of the distal  legs.   Her admission H&H is 5.9/19.6 up to 7.7/24.4 after 2 units of packed rbc's.  Retic count was 5.3% which is elevated, but absolute retic count 154.2 which  is normal.  Iron level is 12.  Iron percent saturation 3%, total iron  binding capacity 477, B12 was 519 and folate was 20.  Her MCV is 67.2.   ASSESSMENT:  1. Iron deficiency anemia secondary to chronic blood loss.  2. Probable slow gastrointestinal bleeding secondary to aspirin and Aleve.  3. Degenerative joint disease.  4. Probable fluid overload.   PLAN:  Discussed with nursing and the patient.  Furosemide 40 mg IV stat has  already been given.  12-lead EKG is pending.  She is due for a third unit of  packed cells  following which she will have a repeat H&H, possibly be  discharged today with outpatient work-up as long as hemoccults are negative.  If she has hemoccult positivity will have GI do a full work-up before  discharge.      Mila Homer. Sudie Bailey, M.D.  Electronically Signed    SDK/MEDQ  D:  09/18/2005  T:  09/18/2005  Job:  [id number]

## 2010-06-13 NOTE — Op Note (Signed)
NAMETAMMYE, [name]              ACCOUNT NO.:  [id number]   MEDICAL RECORD NO.:  [id number]          PATIENT TYPE:  AMB   LOCATION:  DAY                           FACILITY:  [facility]   PHYSICIAN:  R. Roetta Sessions, M.D. DATE OF BIRTH:  [birth year]   DATE OF PROCEDURE:  10/06/2005  DATE OF DISCHARGE:  10/06/2005                                 OPERATIVE REPORT   PROCEDURE:  Small bowel Givens capsule endoscopy.   INDICATIONS FOR PROCEDURE:  The patient is an 77 year old lady who was  recently admitted for profound microcytic anemia with hemoglobin of 5.9 and  MCV 67.2.  She was transfused with 3 units of packed red blood cells  overnight and came back for outpatient workup.  She did have two trace heme  positive stools on different stool checks.  There was no melena.  No NSAID  or aspirin use.  She has a history of known gigantic hiatal hernia,  Schatzki's ring, with recurrent esophageal dysphagia.  She was started on  Protonix 40 mg daily.  On October 01, 2005, she had EGD and colonoscopy.  She was found to have a Schatzki's ring which was dilated, large hiatal  hernia, and antral erosions.  She had a long redundant colon, left sided  diverticula, and diminutive polyps in the left colon which were removed.  H.  pylori serologies were checked, I do not have those results.  Because no  significant findings were found to explain her microcytic anemia, she is  undergoing Givens small bowel capsule today.   FINDINGS:  The patient was able to swallow the capsule without any  difficulty.  The first gastric image was at 28 seconds.  The gastric transit  time was 1 hour 52 minutes, small bowel transit time was 2 hours 11 minutes  with the first cecal image at 4 hours 4 minutes.  She was noted to have  several gastric erosions which were not bleeding.  Small bowel mucosa  appeared normal.   SUMMARY AND RECOMMENDATIONS:  Several non-bleeding gastric erosions,  otherwise, normal appearing study.   The small bowel appeared normal.  No  significant findings to explain profound microcytic anemia and heme positive  stools.  The patient is to follow up with Dr. Jena Gauss for further  recommendations.      Tana Coast, P.AJonathon Bellows, M.D.  Electronically Signed    LL/MEDQ  D:  10/16/2005  T:  10/18/2005  Job:  [id number]   cc:   Mila Homer. Sudie Bailey, M.D.  Fax: [phone]

## 2010-06-13 NOTE — Discharge Summary (Signed)
NAMEJULIANAH, [name]              ACCOUNT NO.:  [id number]   MEDICAL RECORD NO.:  [id number]          PATIENT TYPE:  INP   LOCATION:  A330                          FACILITY:  [facility]   PHYSICIAN:  Mila Homer. Sudie Bailey, M.D.DATE OF BIRTH:  04/11/1915   DATE OF ADMISSION:  09/17/2005  DATE OF DISCHARGE:  08/24/2007LH                                 DISCHARGE SUMMARY   This 77 year old woman was admitted September 17, 2005, discharged September 18, 2005.  She has severe anemia with an admission hemoglobin 5.9.  After three  transfusions of packed red cells, her hemoglobin increased so she will be  discharged home.   LABORATORY DATA:  Her admission hemoglobin and hematocrit is 5.9 and 19.6,  MCV 67, platelet count 456,000, white cell count 7200.  She had a normal  differential.  Retic count was 5.3 and absolute retic count of 154.2.  Her  iron level was 12 (normal 42 to 135, total iron binding capacity was 477 and  the % saturation of iron was 3%).  B12 was 519, folate greater than 20.  Ferritin less than 1.  Her repeat H&H was 7.7 and 24.4 after two units  packed cells and after three units of cells 10.0 and 31.6.   EKG was essentially normal.   HOSPITAL COURSE:  In the hospital she was admitted for 24 hour observation,  put on a regular diet, vital signs four times daily.  Bedside commode.  Was  typed and crossed for 3 units packed cells.  Gastroenterology was called in  consult.  She was put on Protonix 40 mg by mouth daily.  She was given Lasix  40 mg IV when she became somewhat dyspneic after two units of packed cells.   After receiving Lasix, she had good urine output, dyspnea cleared and with a  third unit of blood, hemoglobin came up as above and she was felt to be  stable for discharged home particularly since hemoccult on her stool was  negative for blood.  She was set up outpatient with Dr. Karilyn Cota and Jena Gauss,  gastroenterologists, for esophagogastroduodenoscopy and TCS.   FINAL  DISCHARGE DIAGNOSES:  1. Severe anemia secondary to chronic blood loss.  2. Fluid overload.  3. Degenerative joint disease.  4. Long term use of aspirin and nonsteroidal anti-inflammatory drugs      (Aleve), which probably caused gastrointestinal bleeding.   She was counseled on stopping aspirin, Aleve, any other NSAID.  She is to  use Tylenol alone at this point for pain control for degenerative joint  disease.  Further recommendations will be made after her EGD and total  colonoscopy.      Mila Homer. Sudie Bailey, M.D.  Electronically Signed     SDK/MEDQ  D:  09/28/2005  T:  09/28/2005  Job:  [id number]

## 2010-06-13 NOTE — Discharge Summary (Signed)
[name], AYDELOTT NO.:  [id number]   MEDICAL RECORD NO.:  [id number]          PATIENT TYPE:  INP   LOCATION:  A223                          FACILITY:  [facility]   PHYSICIAN:  Melvyn Novas, MDDATE OF BIRTH:  1915/05/22   DATE OF ADMISSION:  04/22/2006  DATE OF DISCHARGE:  LH                               DISCHARGE SUMMARY   Patient is an 77 year old white female who lives alone; however, her son  lives right next to her.   A history of hypertension, acid reflux, even with some typical/atypical  chest pain.   CAT scan was done, which was nondiagnostic for pulmonary embolism.   Cardiac enzymes were negative.   EKG remained unchanged.  It was hemodynamically stable.  Blood pressure  was 144/71 throughout the hospital in sinus rhythm.   She continued to do well.  She was seen in consultation by cardiology.  A 2D echocardiogram was obtained, which revealed normal LV function.  No  evidence of any wall motion abnormalities or CHF.  Based on cardiology  opinion and clinical condition, it was felt safe to discharge her, as  there was no clinical evidence of ischemia or decompensated CHF.  She  was found to have a large hiatal hernia on chest x-ray.   Her discharge medicines, with the addition of Protonix 40 mg per day,  was written by myself.  Her other discharge medicines are Norvasc 10 mg  per day, aspirin 81 mg daily, and Xanax 0.5 mg b.i.d., which she has at  home.      Melvyn Novas, MD  Electronically Signed     RMD/MEDQ  D:  04/24/2006  T:  04/24/2006  Job:  [id number]   cc:   Mila Homer. Sudie Bailey, M.D.  Fax: [phone]

## 2010-06-13 NOTE — H&P (Signed)
[name], [name]              ACCOUNT NO.:  [id number]   MEDICAL RECORD NO.:  [id number]          PATIENT TYPE:  INP   LOCATION:  A223                          FACILITY:  [facility]   PHYSICIAN:  Mila Homer. Sudie Bailey, M.D.DATE OF BIRTH:  1915-07-04   DATE OF ADMISSION:  04/22/2006  DATE OF DISCHARGE:  LH                              HISTORY & PHYSICAL   The patient woke up last night with chest discomfort, left shoulder  pain.  She also had a cough.   Currently he is being treated at home for hypertension and current meds  brought with her include amlodipine 10 mg daily, KCl 10 mEq b.i.d.,  Vytorin 10/40 daily for her hypercholesterolemia.   She is widowed.  Her husband died about 10 years ago of long-term  sequelae of a stroke.  She has family in the area.   She has a history of a large hiatal hernia.  Has had symptoms from that,  but this does not feel like that.  She had been seen by Dr. Dietrich Pates,  cardiologist, in the past but not recently.   Really has not had GU, GI or other cardiac or pulmonary findings.   ADMISSION EXAM:  Shows a pleasant elderly woman who is oriented and  alert.  She is well-developed and obese.  Temperature is 97.5, blood  pressure 151/81, pulse 87, respiratory rate 18.  O2 sat is 100%.  HEENT:  Mucous membranes are moist.  Skin turgor is normal.  She seemed  to be a good historian.  Sentence structure is intact.  There are no  axillary, supraclavicular or anterior cervical nodes enlarged.  LUNGS:  Show slightly decreased sounds throughout, but she is moving air  well.  No intercostal retraction.  No use of accessory muscles of  respiration.  As I press on the chest she feels some of the discomfort  is right centrally and may be somewhat reproduced by the pressure.  The  heart has a regular rhythm and rate about 70.  ABDOMEN:  Soft and obese without hepatosplenomegaly or mass.  EXTREMITIES:  There is no edema of the ankles.   Her admission white cell  count is 12,500 with 68% neutrophils, 24  lymphs, H&H 14.7/4 9.9, CK-MB is 1.6, troponin less than 0.05.  Myoglobin 94.  B-met shows glucose 117, otherwise normal.  Chest x-ray  shows a large hiatal hernia, otherwise negative.  Her D-dimer is 0.59.   She has had a lung CT which was really unremarkable.   ADMISSION DIAGNOSES:  1. Probable asthmatic bronchitis.  2. Hypercholesterolemia.  3. Benign essential hypertension.  4. Obesity.   PLAN:  She will be put on IV normal saline at 75 mL an hour, given  Rocephin 1 gram IV q.24 h, Zithromax 500 mg IV q.24 h, put her on  Xopenex 1.25 mg and Atrovent neb treatments q.4 h while awake.  O2 at 2  liters a minute by nasal prongs. A 2 gram sodium diet and Lovenox 40 mg  subcutaneously daily prophylactically.  She will be continued on  amlodipine 10 mg daily as well.  We  will get Dr. Dietrich Pates, her  cardiologist, to see her consult.      Mila Homer. Sudie Bailey, M.D.  Electronically Signed     SDK/MEDQ  D:  04/22/2006  T:  04/22/2006  Job:  [id number]

## 2010-06-13 NOTE — Op Note (Signed)
[name], [name]              ACCOUNT NO.:  [id number]   MEDICAL RECORD NO.:  [id number]          PATIENT TYPE:  AMB   LOCATION:  DAY                           FACILITY:  [facility]   PHYSICIAN:  R. Roetta Sessions, M.D. DATE OF BIRTH:  1915-10-14   DATE OF PROCEDURE:  10/01/2005  DATE OF DISCHARGE:                                 OPERATIVE REPORT   PROCEDURE:  EGD with Elease Hashimoto dilation, followed by a colonoscopy with  biopsy.   INDICATIONS FOR PROCEDURE:  The patient is an 77 year old lady followed  primarily by Katharine Look, presented to Dr. Renard Matter (on call) recently.  She was found to  have a profound anemia with a hemoglobin of 5.9, MCV of  67.2, platelet count of 456.  She was admitted overnight and given 3 units  of blood.  We have seen her.  Apparently, she was heme negative and trace  heme positive on two different stool checks.  There has been no melena,  hematochezia or hematemesis.  She has a known gigantic hiatal hernia and a  Schatzki's ring.  She does have recurrent esophageal dysphagia.  We started  her on some Protonix 40 mg orally daily in the office recently.  EGD and  colonoscopy now will be done.  (She has never had a colonoscopy).  This  approach has been discussed with the patient.  The potential risks, benefits  and alternatives have been reviewed.  This approach had been discussed with  multiple family members.  There is a question of aspirin powder use recently  as well.   PROCEDURE NOTE:  O2 saturation, blood pressure, pulses, and respiration were  monitored throughout the entire procedure.  Conscious sedation,  IV Versed,  Demerol 100 mg IV in divided doses __________ anesthesia.   INSTRUMENT:  Olympus video chip system.   FINDINGS:  EGD examination to the tubular esophagus revealed a Schatzki's  ring, esophageal mucosa otherwise appeared normal.  The esophagus was  somewhat foreshortened.  The EG junction easily traversed.   Stomach:  The gastric  cavity was emptied and insufflated well with air.  Thorough examination of gastric mucosa and retroflexion of the proximal  stomach, esophagogastric junction demonstrated a gigantic hiatal hernia.  Mucosa otherwise appeared normal aside from some antral erosions.  The  pylorus was patent and easily traversed.  Examination of the bulb and the  second portion revealed no abnormalities.   THERAPY/DIAGNOSTIC MANEUVERS PERFORMED:  A 56-French Maloney dilator was  passed to full insertion.  __________ revealed the ring had been ruptured  __________ complication.  The patient tolerated the procedure well and was  prepared for colonoscopy.   FINDINGS:  Digital rectal exam revealed no abnormalities.   ENDOSCOPIC FINDINGS:  Prep was adequate.   RECTAL:  Examination of the rectal mucosa, including retroflexion of the  anal verge, revealed no abnormalities.   COLON:  The colonic mucosa was surveyed from the rectosigmoid junction to  the left transverse, right colon to the appendiceal orifice, ileocecal valve  and the cecum.  These structures were well seen and photographed for the  record.  From  this level, the scope was slowly withdrawn.  All previously  mentioned mucosal surfaces were cautiously and carefully reviewed.  The  patient had a long, capacious colon.  The patient had extensive left-sided  diverticula.  We had to change the patient's position to use external bowel  pressure to reach the cecum.  The patient had two diminutive 3-mm polyps,  one at the splenic flexure and one in the sigmoid, which were cold  biopsied/removed.  The remainder of the colonic mucosa was well seen and  appeared normal.  I spent a good 20 minutes coming out (not counting biopsy  time).  I did observe that area of the splenic flexure that during her  respiratory excursions, the colon was pinched off at this level suggesting  part of the colon is up beside the stomach in a diaphragmatic hiatus.  There  was no  evidence of colonic neoplasia or other lesion which would explain  microcytic anemia via mechanism of GI bleeding.  The patient tolerated both  procedures well and was reactive.   ENDOSCOPY IMPRESSION:  Esophagogastroduodenoscopy:  Schatzki's ring status  post diltation as described above, otherwise normal esophagus, large hiatal  hernia, antral erosions otherwise normal gastric mucosa, patent pylorus,  normal D1, D2.   COLONOSCOPY FINDINGS:  A long, capacious, redundant colon, left-sided  diverticula, normal rectum, diminutive polyps in the left colon removed as  described above.   QUERY:  1. Left colon in close proximity to the diaphragm.  2. Remainder of colonic mucosa appeared normal.   RECOMMENDATIONS:  1. She is to continue Protonix 40 mg orally daily.  2. This lady needs Given's small bowel capsule study to be arranged next      week.  3. Further recommendations to follow.  4. Check H. pylori serologies.      Jonathon Bellows, M.D.  Electronically Signed     RMR/MEDQ  D:  10/01/2005  T:  10/01/2005  Job:  [id number]   cc:   Angus G. Renard Matter, MD  Fax: [phone]   Mila Homer. Sudie Bailey, M.D.  Fax: [phone]

## 2010-06-13 NOTE — Group Therapy Note (Signed)
[name], [name]              ACCOUNT NO.:  [id number]   MEDICAL RECORD NO.:  [id number]          PATIENT TYPE:  INP   LOCATION:  A223                          FACILITY:  [facility]   PHYSICIAN:  Mila Homer. Sudie Bailey, M.D.DATE OF BIRTH:  08/28/1915   DATE OF PROCEDURE:  DATE OF DISCHARGE:                                 PROGRESS NOTE   SUBJECTIVE:  She has not any more pain since she has been in the  hospital.  Pain awoke her from sleep night before last was the most  severe she has ever had and involved the anterior chest, breast bone  area, and radiated to the left shoulder.  This lasted a 1/2 hour and  then has cleared since then.   OBJECTIVE:  She is sitting in a  recliner.  Color is good.  She is  oriented and alert.  She is well-developed and obese.  Temperature is  97.5, pulse 96, respiratory rate 16, blood pressure 159/70.  LUNGS:  Today are clear throughout, moving air well, no intercostal  retractions, no use of accessory muscle respiration.  HEART:  A regular rhythm, rate of about 90.  ABDOMEN:  Soft without hepatosplenomegaly or mass or tenderness anywhere  the abdomen, including the upper abdomen.  There is no edema of the ankles.  O2 sat is 96% on 2 liters.   ASSESSMENT:  Severe mid-chest pain, probably secondary to hiatal hernia.   PLAN:  Workup with Dr. Dietrich Pates, Cardiologist, is proceeding.  Meanwhile, continue with IV antibiotics and fluids.      Mila Homer. Sudie Bailey, M.D.  Electronically Signed     SDK/MEDQ  D:  04/23/2006  T:  04/23/2006  Job:  [id number]

## 2010-06-13 NOTE — Consult Note (Signed)
NAMEBELLAMI, FARRELLY              ACCOUNT NO.:  [id number]   MEDICAL RECORD NO.:  [id number]          PATIENT TYPE:  INP   LOCATION:  A223                          FACILITY:  [facility]   PHYSICIAN:  Madolyn Frieze. Jens Som, MD, FACCDATE OF BIRTH:  11-01-1915   DATE OF CONSULTATION:  04/23/2006  DATE OF DISCHARGE:                                 CONSULTATION   HISTORY:  Patient is an 77 year old female with past medical history of  hypertension, hyperlipidemia, hiatal hernia, gastric reflux disease whom  we were asked to evaluate for chest pain.  She has apparently been seen  by Dr. Dietrich Pates in 1999, but I do not have those records available.  She  has no prior cardiac history per her report.  She does have a chronic  dyspnea on exertion; and mild pedal edema, but there is no orthopnea,  PND, or exertional chest pain.  Yesterday morning she developed  substernal sharp chest pain with radiation to the left shoulder.  She is  unclear about the duration, but it was several hours.  It did increase  with cough; and there was no associated nausea, vomiting, shortness of  breath, or diaphoresis.  She came to the emergency room and was admitted  for possible asthmatic bronchitis.  Her workup has included an initial  set of cardiac markers that were negative.  An electrocardiogram is not  available for review.  She had a mildly elevated D-dimer and a CT of her  chest was performed.  There was a large hiatal hernia with scattered  atherosclerotic calcifications in the aorta.  Note, there was poor  contrast opacification of the pulmonary arterial system; and it was,  therefore, felt that the exam was nondiagnostic for the presence of  pulmonary emboli.  Because of her chest pain, cardiology was asked to  evaluate.  Note, she has had no further pain since she was admitted.  Her medications at present include amlodipine 10 mg p.o. daily,  Atrovent, and Xopenex.  She is also receiving Ativan, enoxaparin  40  mg  subcu daily, Rocephin 1 gram IV q.24 h. and azithromycin 500 mg IV q.24  h.   SOCIAL HISTORY:  She has no history of tobacco use; and she does not  consume alcohol.   FAMILY HISTORY:  Her father died of a myocardial infarction in his 69s,  by report; but there is no other immediate family with coronary disease,  by report.   PAST MEDICAL HISTORY:  Significant for hypertension and hyperlipidemia;  but there is no diabetes mellitus.  She has no prior cardiac disease, as  mentioned, in the HPI.  She does have a history of a hiatal hernia and  gastric reflux disease.  She has had a prior appendectomy.  There is no  other past medical history noted.   REVIEW OF SYSTEMS:  She denies any headaches, fevers, or chills.  She  has had diffuse myalgias.  She has had a cough productive of a whitish  sputum; but there is no hemoptysis.  She denies any dysphagia,  odynophagia, melena or hematochezia.  There was no  water brash.  There  is no dysuria or hematuria.  There is no rash or seizure activity.  There is no orthopnea or PND; but there is pedal edema.  There is no  claudication noted.  The remaining systems are negative.   PHYSICAL EXAM:  VITAL SIGNS:  Today shows a blood pressure of 159/70,  and her pulse is 96.  She is afebrile.  She is saturating 96% on 2  liters.  GENERAL:  She is well-developed and obese.  She is in no acute distress.  Skin is warm and dry.  She does not appear to be depressed.  There is no  peripheral clubbing.  Back is normal, although she has had a previous  skin cancer removed from her left upper shoulder.  HEENT:  Unremarkable with normal eyelids.  NECK:  Supple with a normal upstroke bilaterally.  I cannot appreciate  bruits.  There is no jugular distension and no thyromegaly is noted.  CHEST:  Shows minimal crackles at the bases bilaterally.  Her expansion  is normal.  CARDIOVASCULAR EXAM:  Reveals regular rate and rhythm with a normal S1  and S2.  There  are no murmurs, rubs, or gallops appreciated.  Her PMI  cannot be palpated.  ABDOMEN:  Her abdominal exam nontender, nondistended, positive bowel  sounds.  No hepatosplenomegaly.  No mass appreciated.  There is no  abdominal bruit.  She has 1+ femoral pulses bilaterally and no bruits.  EXTREMITIES:  Show trace to 1+ edema bilaterally.  I cannot palpate  cords; and there is a negative Homans' bilaterally.  She has 2+ dorsalis  pedis pulses bilaterally.  NEUROLOGIC EXAM:  Grossly intact.   ELECTROCARDIOGRAM:  Not available for review.   LABORATORY DATA:  Her white blood cell count on admission was 12.5 with  a hemoglobin of 14.7, and a hematocrit 44.4.  Her platelet count is 331.  Her D-dimer was elevated at 0.59.  Her sodium 138 with potassium of 4.1,  BUN and creatinine are 12 and 0.81.  Her initial enzymes were negative,  yesterday morning, with a troponin of less than 0.05.   CHEST X-RAY:  Showed a large hiatal hernia, but no acute findings were  noted.   DIAGNOSIS:  1. Chest pain.  2. Hypertension.  3. Hyperlipidemia.  4. History of hiatal hernia.  5. Gastroesophageal reflux disease.   PLAN:  Patient has been admitted with chest pain that is somewhat  atypical.  I would recommend repeating her enzymes to rule out  myocardial infarction.  I would also plan to check a baseline  electrocardiogram.  If these are unremarkable, then I would plan an  outpatient Myoview for risk stratification (note, the patient has eaten  breakfast this morning; and therefore this study cannot be performed  today).  Her pain certainly may be musculoskeletal as it increases with  cough; and she has had a mildly productive cough with a question of a  viral syndrome.  It may also be reviewed GI related as she does have a  hiatal hernia and gastroesophageal reflux disease.  I would recommend adding enteric-coated aspirin 81 mg p.o. daily; while she is ruled out  for infarct.  I would also add Protonix  40 mg p.o. daily.  We will make  further decisions once we have the results of her electrocardiogram and  her cardiac markers.  Also note that her CT was nondiagnostic.  I will  leave the issue of question pulmonary embolus, to the primary  care  service although it seems less likely.      Madolyn Frieze Jens Som, MD, [facility]  Electronically Signed     BSC/MEDQ  D:  04/23/2006  T:  04/23/2006  Job:  [id number]

## 2010-06-13 NOTE — Procedures (Signed)
NAMESUJATA, MAINES              ACCOUNT NO.:  [id number]   MEDICAL RECORD NO.:  [id number]          PATIENT TYPE:  INP   LOCATION:  A223                          FACILITY:  [facility]   PHYSICIAN:  Madolyn Frieze. Jens Som, MD, FACCDATE OF BIRTH:  1915/01/30   DATE OF PROCEDURE:  04/23/2006  DATE OF DISCHARGE:                                ECHOCARDIOGRAM   The patient is an 77 year old female who was admitted to [facility] with chest pain.  This study is performed to quantify left  ventricular function.  It is technically adequate.  The two-dimensional  measurements are as follows:  Left ventricular end-diastolic dimension  is 3.5 cm.  Left ventricular end-systolic dimension is 2.5 cm.  The  aorta is 2.8 cm and the left atrium is 3 cm.  The septum is 1.4 cm and  the posterior wall is 1.3 cm.   The overall left ventricular function is normal with an estimated  ejection fraction of 55-65%.  There are no focal wall motion  abnormalities noted.  There is mild to moderate concentric left  ventricular hypertrophy.  There is mild left atrial enlargement.  The  right atrium and right ventricle appear to be normal.  There is no  significant pericardial effusion.   The aortic valve is trileaflet and mildly sclerotic.  By Doppler, there  is no aortic stenosis, but there is trace aortic insufficiency.  There  is mild mitral annular calcification and trace mitral regurgitation.  There is trace tricuspid regurgitation.  The mitral inflow pattern is  suggestive of relaxation abnormality.   FINAL INTERPRETATION:  1. Normal left ventricular function.  2. Mild to moderate concentric left ventricular hypertrophy.  3. Mild left atrial enlargement.  4. Trace mitral, aortic and tricuspid regurgitation.  5. Relaxation abnormality.      Madolyn Frieze Jens Som, MD, [facility]  Electronically Signed     BSC/MEDQ  D:  04/23/2006  T:  04/23/2006  Job:  [id number]   cc:   Mila Homer. Sudie Bailey, M.D.  Fax:  [phone]

## 2010-06-13 NOTE — Procedures (Signed)
NAMEGIORGIA, [name]              ACCOUNT NO.:  [id number]   MEDICAL RECORD NO.:  [id number]          PATIENT TYPE:  OUT   LOCATION:  RAD                           FACILITY:  [facility]   PHYSICIAN:  Edward L. Juanetta Gosling, M.D.DATE OF BIRTH:  1915-02-22   DATE OF PROCEDURE:  DATE OF DISCHARGE:  01/08/2006                            PULMONARY FUNCTION TEST   IMPRESSION:  1. Spirometry shows a mild ventilatory defect with evidence of air      flow obstruction.  2. Lung volume determination was not able to be done.  3. DLCO is mild to moderately reduced.  4. Arterial blood gases are normal.      Edward L. Juanetta Gosling, M.D.  Electronically Signed     ELH/MEDQ  D:  01/12/2006  T:  01/13/2006  Job:  [id number]

## 2010-06-13 NOTE — Op Note (Signed)
[facility]  Patient:    [name], [name] Visit Number: [id number] MRN: [id number]          Service Type: END Location: DAY Attending Physician:  Jonathon Bellows Dictated by:   Roetta Sessions, M.D. Proc. Date: 06/30/01 Admit Date:  06/30/2001   CC:         John Giovanni, M.D.   Operative Report  PROCEDURE:  Esophagogastroduodenoscopy.  ENDOSCOPIST:  Roetta Sessions, M.D.  INDICATIONS FOR PROCEDURE:  The patient is an 77 year old lady with chronic cough and hoarseness over the past couple of months.  Prior plain films and CT scan demonstrated a large hiatal hernia.  This lady denies any typical reflux symptoms, specifically no heartburn.  She denies any dysphagia.  EGD is now being done to further evaluate her symptoms and the abnormal radiographic findings.  This approach has been discussed with [name] previously and again at the patients bedside the potential risks, benefits, and alternatives have been discussed and question answered and she is agreeable.  Please see my dictated consultation note of Jun 14, 2001 for more information.  PROCEDURE NOTE:  Oxygen saturation, blood pressure, pulse, and respirations were monitored throughout the entire procedure under conscious sedation with IV Versed and Demerol in incremental doses, cetacaine spray for topical oropharyngeal anesthesia.  INSTRUMENT:  Olympus video chip gastroscope.  FINDINGS:  Examination of the tubular esophagus revealed a foreshortened esophagus.  The esophagogastric junction was identified at approximately 30 cm from the incisors.  There was a Schatzkis ring.  The esophageal mucosa otherwise appeared entirely normal.  The EG junction was easily traversed.  Stomach - The gastric cavity was empty and insufflated well with air. Thorough examination of the gastric mucosa including a retroflex view of the esophagogastric junction demonstrated a huge hiatal hernia, with  two-thirds of the stomach above the diaphragmatic hiatus.  Please see photos.  The pylorus was patent and easily traversed.  Duodenum  - The bulb and second portion appeared normal.  THERAPEUTIC/DIAGNOSTIC MANEUVERS PERFORMED:  None.  The patient tolerated the procedure well and was reacted in endoscopy.  IMPRESSION:  1. Foreshortened esophagus.  Schatzkis ring not manipulated because the     patient is not experiencing any dysphagia.  2. Massive hiatal hernia with two-thirds of the stomach above the     diaphragmatic hiatus.  Gastric mucosa appeared normal.  Normal first and     second duodenum.  3. The patient has a big hiatal hernia.  More than likely she is experiencing     gastroesophageal reflux although she does not have any typical symptoms.     Her hoarseness and cough may be secondary to laryngopharyngeal reflux or     in part the esophagopulmonary reflex.  She really needs to have ENT     evaluation early on to make sure there is nothing else going on     contributing to her hoarseness.  RECOMMENDATIONS:  1. Will go ahead and empirically start her on a trial of aggressive acid     suppression therapy with Nexium 40 mg p.o. b.i.d.  2. Will get her in to see one of the ENT physicians locally.  4. Further recommendations to follow. Dictated by:   Roetta Sessions, M.D. Attending Physician:  Jonathon Bellows DD:  06/30/01 TD:  07/04/01 Job: [id number]

## 2010-06-13 NOTE — H&P (Signed)
[name], [name]              ACCOUNT NO.:  [id number]   MEDICAL RECORD NO.:  [id number]          PATIENT TYPE:  OBV   LOCATION:  A330                          FACILITY:  [facility]   PHYSICIAN:  Angus G. Renard Matter, MD   DATE OF BIRTH:  04/17/1915   DATE OF ADMISSION:  09/17/2005  DATE OF DISCHARGE:  LH                                HISTORY & PHYSICAL   An 77 year old white female, a patient of Dr. Michelle Nasuti, was sent to our  office today for evaluation of severe anemia.  The patient apparently had  blood work done in Dr. Michelle Nasuti office which showed a hemoglobin of 5.9  with hematocrit of 23.6, MCV 75, MCHC 25, RDW 19.5, sedimentation rate 48.  The patient was noted to be extremely pale and had been having weakness,  fatigability.  She was examined in the office, did not show any evidence of  blood in stool.  She denied any dark stools.  I discussed the situation with  Dr. Virginia Rochester, who agreed that she should be admitted to 24-hour observation for  further evaluation and transfusion.   SOCIAL HISTORY:  Patient does not smoke or drink alcohol.   FAMILY HISTORY:  Not available.   PAST MEDICAL/SURGICAL HISTORY:  Surgery:  Patient had appendectomy as a  child, has had several normal deliveries.  Medical problems include hypertension, dyslipidemia and sleep apnea.   ALLERGIES:  PATIENT HAS NO KNOWN ALLERGIES.   MEDICINES:  Patient's medication list unknown.   REVIEW OF SYSTEMS:  HEENT:  Negative.  CARDIOPULMONARY:  Patient has had a  productive cough over the past few days.  GI:  No nausea, vomiting,  diarrhea, no abdominal pain.  GU:  No dysuria, hematuria.   PHYSICAL EXAMINATION:  GENERAL:  Pale, weak-appearing white female.  VITAL SIGNS:  Blood pressure 120/80, pulse 60, respiration 18, temp 98.  HEENT:  Eyes:  PERRLA, TM negative.  Oropharynx benign with exception of  pallor.  NECK:  Supple, no JVD or thyroid abnormalities.  HEART:  Regular rhythm, no murmurs, no thyromegaly.  BREAST:  Normal masses.  LUNGS:  Clear to P&A.  ABDOMEN:  No palpable organs or masses, no organomegaly.  PELVIC:  Genitalia normal.  Uterus normal size, shape, no masses.  RECTAL:  Examination negative.  No evidence of blood, heme-occult negative.   DIAGNOSES:  1. Severe anemia, iron deficiency, undetermined etiology.      Angus G. Renard Matter, MD  Electronically Signed     AGM/MEDQ  D:  09/17/2005  T:  09/17/2005  Job:  [id number]

## 2010-09-22 ENCOUNTER — Other Ambulatory Visit: Payer: Self-pay | Admitting: *Deleted

## 2010-09-22 MED ORDER — CLOPIDOGREL BISULFATE 75 MG PO TABS: 75.0000 mg | ORAL_TABLET | Freq: Every day | ORAL | Status: DC

## 2010-10-27 ENCOUNTER — Other Ambulatory Visit: Payer: Self-pay | Admitting: *Deleted

## 2010-10-27 MED ORDER — CLOPIDOGREL BISULFATE 75 MG PO TABS: 75.0000 mg | ORAL_TABLET | Freq: Every day | ORAL | Status: DC

## 2010-11-04 LAB — URINALYSIS, ROUTINE W REFLEX MICROSCOPIC
Glucose, UA: NEGATIVE
Hgb urine dipstick: NEGATIVE
Protein, ur: 30 — AB

## 2010-11-04 LAB — DIFFERENTIAL
Basophils Absolute: 0.1
Basophils Relative: 1
Eosinophils Relative: 1
Lymphocytes Relative: 19
Monocytes Absolute: 0.7

## 2010-11-04 LAB — URINE MICROSCOPIC-ADD ON

## 2010-11-04 LAB — BASIC METABOLIC PANEL
BUN: 15
CO2: 25
Calcium: 8.9
Chloride: 102
Creatinine, Ser: 1.07
GFR calc non Af Amer: 48 — ABNORMAL LOW
Glucose, Bld: 148 — ABNORMAL HIGH
Potassium: 3.5
Potassium: 3.6
Sodium: 138

## 2010-11-04 LAB — CBC
HCT: 47.9 — ABNORMAL HIGH
Hemoglobin: 16.1 — ABNORMAL HIGH
MCHC: 33.7
RDW: 13

## 2010-11-26 ENCOUNTER — Other Ambulatory Visit: Payer: Self-pay | Admitting: Cardiology

## 2011-01-08 ENCOUNTER — Encounter: Payer: Self-pay | Admitting: Cardiology

## 2011-03-28 ENCOUNTER — Emergency Department (HOSPITAL_COMMUNITY)
Admission: EM | Admit: 2011-03-28 | Discharge: 2011-03-28 | Disposition: A | Discharge status: Home / Self Care | Payer: Medicare Other | Attending: Emergency Medicine | Admitting: Emergency Medicine

## 2011-03-28 ENCOUNTER — Emergency Department (HOSPITAL_COMMUNITY): Payer: Medicare Other

## 2011-03-28 ENCOUNTER — Encounter (HOSPITAL_COMMUNITY): Payer: Self-pay | Admitting: *Deleted

## 2011-03-28 DIAGNOSIS — S7000XA Contusion of unspecified hip, initial encounter: Secondary | ICD-10-CM | POA: Insufficient documentation

## 2011-03-28 DIAGNOSIS — W19XXXA Unspecified fall, initial encounter: Secondary | ICD-10-CM

## 2011-03-28 DIAGNOSIS — K219 Gastro-esophageal reflux disease without esophagitis: Secondary | ICD-10-CM | POA: Insufficient documentation

## 2011-03-28 DIAGNOSIS — E785 Hyperlipidemia, unspecified: Secondary | ICD-10-CM | POA: Insufficient documentation

## 2011-03-28 DIAGNOSIS — I10 Essential (primary) hypertension: Secondary | ICD-10-CM | POA: Insufficient documentation

## 2011-03-28 DIAGNOSIS — S46909A Unspecified injury of unspecified muscle, fascia and tendon at shoulder and upper arm level, unspecified arm, initial encounter: Secondary | ICD-10-CM | POA: Insufficient documentation

## 2011-03-28 DIAGNOSIS — W010XXA Fall on same level from slipping, tripping and stumbling without subsequent striking against object, initial encounter: Secondary | ICD-10-CM | POA: Insufficient documentation

## 2011-03-28 DIAGNOSIS — Z79899 Other long term (current) drug therapy: Secondary | ICD-10-CM | POA: Insufficient documentation

## 2011-03-28 DIAGNOSIS — Z7982 Long term (current) use of aspirin: Secondary | ICD-10-CM | POA: Insufficient documentation

## 2011-03-28 DIAGNOSIS — S4980XA Other specified injuries of shoulder and upper arm, unspecified arm, initial encounter: Secondary | ICD-10-CM | POA: Insufficient documentation

## 2011-03-28 DIAGNOSIS — M25519 Pain in unspecified shoulder: Secondary | ICD-10-CM | POA: Insufficient documentation

## 2011-03-28 DIAGNOSIS — M25559 Pain in unspecified hip: Secondary | ICD-10-CM | POA: Insufficient documentation

## 2011-03-28 MED ORDER — HYDROCODONE-ACETAMINOPHEN 5-325 MG PO TABS: 1.0000 | ORAL_TABLET | Freq: Four times a day (QID) | ORAL | Status: AC | PRN

## 2011-03-28 NOTE — ED Notes (Signed)
Helped pt get dressed again

## 2011-03-28 NOTE — ED Notes (Signed)
Pt states she got up to use the restroom, tripped & landed on her left arm. Pt complains of left arm & shoulder pain.

## 2011-03-28 NOTE — ED Provider Notes (Signed)
History     CSN: [id number]  Arrival date & time 03/28/11  4098   First MD Initiated Contact with Patient 03/28/11 0405      Chief Complaint  Patient presents with  . Fall  . Arm Injury  . Shoulder Injury    (Consider location/radiation/quality/duration/timing/severity/associated sxs/prior treatment) Patient is a 78 y.o. female presenting with fall, arm injury, and shoulder injury. The history is provided by the patient and a relative.  Fall The accident occurred less than 1 hour ago. The fall occurred while walking. She fell from a height of 3 to 5 ft. She landed on a hard floor. There was no blood loss. The point of impact was the left hip and left shoulder. The pain is present in the left hip and left shoulder. The pain is at a severity of 5/10. The pain is mild. She was ambulatory at the scene. There was no entrapment after the fall. There was no drug use involved in the accident. There was no alcohol use involved in the accident. Pertinent negatives include no visual change, no fever, no numbness, no abdominal pain, no bowel incontinence, no nausea, no vomiting, no hematuria, no headaches, no loss of consciousness and no tingling.  Arm Injury  Pertinent negatives include no chest pain, no numbness, no visual disturbance, no abdominal pain, no bowel incontinence, no nausea, no vomiting, no headaches, no neck pain, no loss of consciousness, no tingling and no cough.  Shoulder Injury Pertinent negatives include no chest pain, no abdominal pain, no headaches and no shortness of breath.   Patient brought in by son tripped on the way to the bathroom landed on hard floor with pain to left hip and left shoulder was eventually able to get back on her feet with assistance from her son and was able to ambulate. Past Medical History  Diagnosis Date  . Hyperlipidemia, mild   . GERD (gastroesophageal reflux disease)   . Essential hypertension   . Chest pain     Past Surgical History    Procedure Date  . Appendectomy     Family History  Problem Relation Age of Onset  . Brain cancer Mother   . Heart disease Father   . Heart attack Brother     History  Substance Use Topics  . Smoking status: Unknown If Ever Smoked  . Smokeless tobacco: Not on file  . Alcohol Use: No    OB History    Grav Para Term Preterm Abortions TAB SAB Ect Mult Living                  Review of Systems  Constitutional: Negative for fever and diaphoresis.  HENT: Negative for nosebleeds, congestion, neck pain and neck stiffness.   Eyes: Negative for visual disturbance.  Respiratory: Negative for cough and shortness of breath.   Cardiovascular: Negative for chest pain.  Gastrointestinal: Negative for nausea, vomiting, abdominal pain, diarrhea and bowel incontinence.  Genitourinary: Negative for dysuria and hematuria.  Musculoskeletal: Negative for back pain.  Skin: Negative for rash and wound.  Neurological: Negative for dizziness, tingling, loss of consciousness, speech difficulty, numbness and headaches.  Hematological: Does not bruise/bleed easily.    Allergies  Review of patient's allergies indicates no known allergies.  Home Medications   Current Outpatient Rx  Name Route Sig Dispense Refill  . ALPRAZOLAM 0.5 MG PO TABS Oral Take 0.5 mg by mouth 2 (two) times daily.      Marland Kitchen AMLODIPINE BESYLATE 10 MG PO TABS  Oral Take 10 mg by mouth daily.      . ASPIRIN 81 MG PO TABS Oral Take 81 mg by mouth daily.      . BUMETANIDE 1 MG PO TABS Oral Take 1 mg by mouth daily.      Marland Kitchen CALCIUM CARBONATE-VITAMIN D 500-200 MG-UNIT PO TABS Oral Take 1 tablet by mouth daily.      Marland Kitchen CLOPIDOGREL BISULFATE 75 MG PO TABS  TAKE ONE (1) TABLET BY MOUTH EVERY      DAY 30 tablet 6  . COLCHICINE 0.6 MG PO TABS Oral Take 0.6 mg by mouth 3 (three) times daily.      Marland Kitchen FERROUS SULFATE 325 (65 FE) MG PO TABS Oral Take 325 mg by mouth as directed.      Marland Kitchen HYDROCODONE-ACETAMINOPHEN 5-325 MG PO TABS Oral Take 1-2  tablets by mouth every 6 (six) hours as needed for pain. 10 tablet 0  . LISINOPRIL 20 MG PO TABS Oral Take 20 mg by mouth daily.      Marland Kitchen MAGNESIUM HYDROXIDE 400 MG/5ML PO SUSP Oral Take by mouth daily as needed.      Marland Kitchen ONE-DAILY MULTI VITAMINS PO TABS Oral Take 1 tablet by mouth daily.      Marland Kitchen NAPROXEN SODIUM 220 MG PO TABS Oral Take 220 mg by mouth as directed.      Marland Kitchen POTASSIUM CHLORIDE CRYS ER 20 MEQ PO TBCR Oral Take 20 mEq by mouth daily.      Marland Kitchen VITAMIN C 500 MG PO TABS Oral Take 500 mg by mouth daily.        BP 148/72  Pulse 77  Temp(Src) 97.8 F (36.6 C) (Oral)  Resp 17  Ht 5\' 4"  (1.626 m)  Wt 188 lb (85.276 kg)  BMI 32.27 kg/m2  SpO2 98%  Physical Exam  Nursing note and vitals reviewed. Constitutional: She is oriented to person, place, and time. She appears well-developed and well-nourished. No distress.  HENT:  Head: Normocephalic and atraumatic.  Mouth/Throat: Oropharynx is clear and moist.  Eyes: Conjunctivae and EOM are normal. Pupils are equal, round, and reactive to light.  Neck: Normal range of motion. Neck supple.  Cardiovascular: Normal rate, regular rhythm, normal heart sounds and intact distal pulses.   No murmur heard. Pulmonary/Chest: Effort normal and breath sounds normal. No respiratory distress. She has no wheezes. She has no rales. She exhibits no tenderness.  Abdominal: Soft. Bowel sounds are normal. There is no tenderness.  Musculoskeletal: Normal range of motion. She exhibits tenderness.       Mild tenderness left shoulder no deformity neurovascularly intact distally no wrist pain or hand pain on the left side mild left hip discomfort no deformity no shortening the leg no external rotation good distal neurovascular on that side.  Neurological: She is alert and oriented to person, place, and time. No cranial nerve deficit. She exhibits normal muscle tone. Coordination normal.  Skin: Skin is warm. No rash noted.    ED Course  Procedures (including critical  care time)  Labs Reviewed - No data to display Dg Hip Bilateral W/pelvis  03/28/2011  *RADIOLOGY REPORT*  Clinical Data: Status post fall; bilateral hip pain.  BILATERAL HIP WITH PELVIS - 4+ VIEW  Comparison: Pelvis radiograph performed 12/12/2006  Findings: There is no evidence of fracture or dislocation.  Both femoral heads are seated normally within their respective acetabula.  There is superior joint space narrowing at the left hip, with mild associated sclerotic change.  Both  proximal femurs appear intact.  Degenerative change is noted along the lower lumbar spine.  Minimal sclerotic change is seen at the sacroiliac joints.  The visualized bowel gas pattern is grossly unremarkable in appearance.  IMPRESSION:  1.  No evidence of fracture or dislocation. 2.  Mild degenerative change at the left hip and at the lower lumbar spine.  Original Report Authenticated By: Tonia Ghent, M.D.   Dg Shoulder Left  03/28/2011  *RADIOLOGY REPORT*  Clinical Data: Status post fall; left shoulder pain.  LEFT SHOULDER - 2+ VIEW  Comparison: None.  Findings: There is no evidence of fracture or dislocation.  The left humeral head is seated within the glenoid fossa.  Mild degenerative change is noted at the left acromioclavicular joint. No significant soft tissue abnormalities are seen.  The visualized portions of the left lung are clear.  Calcification is noted within the thoracic aorta.  IMPRESSION: No evidence of fracture or dislocation.  Original Report Authenticated By: Tonia Ghent, M.D.   Dg Humerus Left  03/28/2011  *RADIOLOGY REPORT*  Clinical Data: Status post fall; left shoulder and humerus pain.  LEFT HUMERUS - 2+ VIEW  Comparison: Chest radiograph performed 02/02/2010  Findings: The left humerus appears intact.  The left humeral head remains seated at the glenoid fossa.  No fractures are seen.  Mild degenerative change is noted at the left acromioclavicular joint. No significant soft tissue abnormalities are  characterized on radiograph.  The left elbow is incompletely characterized, but appears grossly unremarkable; no definite elbow joint effusion is seen.  IMPRESSION: No evidence of fracture or dislocation.  Original Report Authenticated By: Tonia Ghent, M.D.     1. Fall   2. Shoulder pain   3. Contusion, hip       MDM  Fall at home while trying to go to the bathroom tripped and landed on her left arm complained of left hip lift arm and shoulder pain no loss of consciousness did not hit the head. It initially could not get back up on her feet but her son was called and he helped her to her feeding she was able to walk to the car.   X-ray workup negative for any evidence of hip fracture left shoulder or left humerus fracture.         Shelda Jakes, MD 03/28/11 [phone]

## 2011-03-28 NOTE — Discharge Instructions (Signed)
X-ray workup of the hips and left shoulder and arm negative for fractures. Suspect she'll be sore and bruised for a few days. Take hydrocodone pain medicine as needed. Rest. Followup with your doctor if not improving in 2-4 days.

## 2011-03-28 NOTE — ED Notes (Signed)
Mr. Warshawsky called back and will come pick pt up

## 2011-03-28 NOTE — ED Notes (Signed)
Called pt son per her request to come pick her up.  No answer, message left

## 2011-03-28 NOTE — ED Notes (Signed)
Pt son Lulabelle Desta may be reached if needed.  His number is [phone].  At this time pt is resting, no distress

## 2011-10-24 ENCOUNTER — Emergency Department (HOSPITAL_COMMUNITY)
Admission: EM | Admit: 2011-10-24 | Discharge: 2011-10-24 | Disposition: A | Discharge status: Home / Self Care | Payer: Medicare Other | Attending: Emergency Medicine | Admitting: Emergency Medicine

## 2011-10-24 ENCOUNTER — Encounter (HOSPITAL_COMMUNITY): Payer: Self-pay

## 2011-10-24 DIAGNOSIS — Y92009 Unspecified place in unspecified non-institutional (private) residence as the place of occurrence of the external cause: Secondary | ICD-10-CM | POA: Insufficient documentation

## 2011-10-24 DIAGNOSIS — Z8249 Family history of ischemic heart disease and other diseases of the circulatory system: Secondary | ICD-10-CM | POA: Insufficient documentation

## 2011-10-24 DIAGNOSIS — I10 Essential (primary) hypertension: Secondary | ICD-10-CM | POA: Insufficient documentation

## 2011-10-24 DIAGNOSIS — W19XXXA Unspecified fall, initial encounter: Secondary | ICD-10-CM | POA: Insufficient documentation

## 2011-10-24 DIAGNOSIS — Z808 Family history of malignant neoplasm of other organs or systems: Secondary | ICD-10-CM | POA: Insufficient documentation

## 2011-10-24 DIAGNOSIS — K219 Gastro-esophageal reflux disease without esophagitis: Secondary | ICD-10-CM | POA: Insufficient documentation

## 2011-10-24 NOTE — ED Notes (Signed)
Pt states she was attempting to get out of her chair and fell on her knees

## 2011-10-24 NOTE — ED Provider Notes (Signed)
History   This chart was scribed for Tobin Chad, MD, by Frederik Pear. The patient was seen in room APA11/APA11 and the patient's care was started at 1141.    CSN: [id number]  Arrival date & time 10/24/11  1044   First MD Initiated Contact with Patient 10/24/11 1141      Chief Complaint  Patient presents with  . Fall    (Consider location/radiation/quality/duration/timing/severity/associated sxs/prior treatment) HPI Comments: [name] is a 78 y.o. female who presents to the Emergency Department complaining of  falling face down after standing up to get out of a chair in her home PTA. Pt denies any LOC or sitting for an extended period of time before falling. Pt states that this is her third fall. Pt's son reports that when he arrived at her home that she was lying face down and had been for approximately 25 minutes. Pt denies any associated pain, including hip, knee, abdominal, back and head or fever. Pt lives alone and uses a walker.  She states she has had problems when attempting to stand from low-sitting chairs.              Patient is a 78 y.o. female presenting with fall. The history is provided by the patient. No language interpreter was used.  Fall The accident occurred 1 to 2 hours ago. The fall occurred while standing. She fell from a height of 1 to 2 ft. She landed on a hard floor. There was no blood loss. The pain is at a severity of 0/10. The patient is experiencing no pain. She was not ambulatory at the scene. There was no entrapment after the fall. There was no drug use involved in the accident. There was no alcohol use involved in the accident. Pertinent negatives include no fever, no abdominal pain, no nausea, no vomiting, no headaches, no loss of consciousness and no tingling. She has tried nothing for the symptoms.    Past Medical History  Diagnosis Date  . Hyperlipidemia, mild   . GERD (gastroesophageal reflux disease)   . Essential hypertension    . Chest pain     Past Surgical History  Procedure Date  . Appendectomy     Family History  Problem Relation Age of Onset  . Brain cancer Mother   . Heart disease Father   . Heart attack Brother     History  Substance Use Topics  . Smoking status: Unknown If Ever Smoked  . Smokeless tobacco: Not on file  . Alcohol Use: No    OB History    Grav Para Term Preterm Abortions TAB SAB Ect Mult Living                  Review of Systems  Constitutional: Negative for fever.  HENT:       Pt denies any head pain.  Gastrointestinal: Negative for nausea, vomiting and abdominal pain.  Musculoskeletal: Negative for back pain.       Pt denies any hip or knee pain.  Neurological: Negative for tingling, loss of consciousness and headaches.       Pt denies LOC.  All other systems reviewed and are negative.    Allergies  Review of patient's allergies indicates no known allergies.  Home Medications   Current Outpatient Rx  Name Route Sig Dispense Refill  . ALPRAZOLAM 0.5 MG PO TABS Oral Take 0.5 mg by mouth 2 (two) times daily.      Marland Kitchen AMLODIPINE BESYLATE 10 MG  PO TABS Oral Take 10 mg by mouth daily.      . ASPIRIN 81 MG PO TABS Oral Take 81 mg by mouth daily.     . BUMETANIDE 1 MG PO TABS Oral Take 1 mg by mouth daily.      Marland Kitchen CALCIUM CARBONATE-VITAMIN D 500-200 MG-UNIT PO TABS Oral Take 1 tablet by mouth daily.      Marland Kitchen CLOPIDOGREL BISULFATE 75 MG PO TABS  TAKE ONE (1) TABLET BY MOUTH EVERY      DAY 30 tablet 6  . COLCHICINE 0.6 MG PO TABS Oral Take 0.6 mg by mouth 3 (three) times daily.      Marland Kitchen FERROUS SULFATE 325 (65 FE) MG PO TABS Oral Take 325 mg by mouth as directed.      Marland Kitchen LISINOPRIL 20 MG PO TABS Oral Take 20 mg by mouth daily.      Marland Kitchen MAGNESIUM HYDROXIDE 400 MG/5ML PO SUSP Oral Take by mouth daily as needed.      Marland Kitchen ONE-DAILY MULTI VITAMINS PO TABS Oral Take 1 tablet by mouth daily.      Marland Kitchen NAPROXEN SODIUM 220 MG PO TABS Oral Take 220 mg by mouth as directed.      Marland Kitchen  POTASSIUM CHLORIDE CRYS ER 20 MEQ PO TBCR Oral Take 20 mEq by mouth daily.      Marland Kitchen VITAMIN C 500 MG PO TABS Oral Take 500 mg by mouth daily.        BP 140/65  Pulse 71  Temp 97.8 F (36.6 C)  Resp 20  Ht 5\' 2"  (1.575 m)  SpO2 96%  Physical Exam  Nursing note and vitals reviewed. Constitutional: She is oriented to person, place, and time. She appears well-developed and well-nourished. She is active. No distress. She is not intubated.  HENT:  Head: Normocephalic and atraumatic. Head is without raccoon's eyes, without Battle's sign, without contusion, without right periorbital erythema and without left periorbital erythema. No trismus in the jaw.  Right Ear: Hearing, tympanic membrane, external ear and ear canal normal. No mastoid tenderness. No hemotympanum.  Left Ear: Hearing, tympanic membrane, external ear and ear canal normal. No mastoid tenderness. No hemotympanum.  Nose: Nose normal. No mucosal edema, rhinorrhea, nose lacerations or sinus tenderness. No epistaxis.  Mouth/Throat: Uvula is midline, oropharynx is clear and moist and mucous membranes are normal. Mucous membranes are not pale and not cyanotic. She has dentures. No uvula swelling. No oropharyngeal exudate, posterior oropharyngeal edema, posterior oropharyngeal erythema or tonsillar abscesses.  Eyes: Conjunctivae normal, EOM and lids are normal. Pupils are equal, round, and reactive to light. Right eye exhibits no discharge and no exudate. Left eye exhibits no discharge and no exudate. Right conjunctiva is not injected. Right conjunctiva has no hemorrhage. Left conjunctiva is not injected. No scleral icterus. Right eye exhibits normal extraocular motion and no nystagmus. Left eye exhibits normal extraocular motion and no nystagmus. Right pupil is round and reactive. Left pupil is round and reactive. Pupils are equal.  Neck: Trachea normal, normal range of motion, full passive range of motion without pain and phonation normal. Neck  supple. No JVD present. No tracheal tenderness, no spinous process tenderness and no muscular tenderness present. Carotid bruit is not present. No rigidity. No tracheal deviation, no edema, no erythema and normal range of motion present. No thyromegaly present.  Cardiovascular: Normal rate, regular rhythm, intact distal pulses and normal pulses.  PMI is not displaced.  Exam reveals no gallop and no decreased pulses.  Murmur heard.  Systolic murmur is present with a grade of 2/6  Pulmonary/Chest: Breath sounds normal. No accessory muscle usage or stridor. No apnea, not tachypneic and not bradypneic. She is not intubated. No respiratory distress. She has no decreased breath sounds. She has no wheezes. She has no rhonchi. She has no rales.  Abdominal: Soft. Bowel sounds are normal. She exhibits no distension. There is no hepatosplenomegaly. There is no tenderness. There is no rigidity, no rebound, no guarding, no CVA tenderness, no tenderness at McBurney's point and negative Murphy's sign.  Musculoskeletal: Normal range of motion. She exhibits no edema and no tenderness.       Intact with nl ROM X4  Neurological: She is alert and oriented to person, place, and time. No cranial nerve deficit. She exhibits normal muscle tone. Coordination normal.  Skin: Skin is warm and dry. No rash noted. No erythema. No pallor.  Psychiatric: She has a normal mood and affect. Her behavior is normal.    ED Course  Procedures (including critical care time)  DIAGNOSTIC STUDIES: Oxygen Saturation is 96% on room air, adequate by my interpretation.    COORDINATION OF CARE:  12:20- Discussed planned course of treatment with the patient, including using continue using her walker and using extreme caution, who is agreeable at this time.     Labs Reviewed - No data to display No results found.   No diagnosis found.    MDM  Pt presents s/p a mechanical fall.  She denies any pain and states that she feels fine.   Note stable VS and a nl exam.  Plan allow pt to stand and ambulate (with a walker) as she would do at home.  If she is stable on her feet, Plan discharge home.   No instability noted.  Pt comfortable, NAD.  Plan discharge home.  I personally performed the services described in this documentation, which was scribed in my presence. The recorded information has been reviewed and considered.   Tobin Chad, MD 10/24/11 1322

## 2012-03-19 ENCOUNTER — Encounter (HOSPITAL_COMMUNITY): Payer: Self-pay | Admitting: Emergency Medicine

## 2012-03-19 ENCOUNTER — Emergency Department (HOSPITAL_COMMUNITY): Payer: Medicare Other

## 2012-03-19 ENCOUNTER — Emergency Department (HOSPITAL_COMMUNITY)
Admission: EM | Admit: 2012-03-19 | Discharge: 2012-03-19 | Disposition: A | Discharge status: Home / Self Care | Payer: Medicare Other | Attending: Emergency Medicine | Admitting: Emergency Medicine

## 2012-03-19 DIAGNOSIS — K449 Diaphragmatic hernia without obstruction or gangrene: Secondary | ICD-10-CM | POA: Insufficient documentation

## 2012-03-19 DIAGNOSIS — R0989 Other specified symptoms and signs involving the circulatory and respiratory systems: Secondary | ICD-10-CM | POA: Insufficient documentation

## 2012-03-19 DIAGNOSIS — R059 Cough, unspecified: Secondary | ICD-10-CM | POA: Insufficient documentation

## 2012-03-19 DIAGNOSIS — Z7982 Long term (current) use of aspirin: Secondary | ICD-10-CM | POA: Insufficient documentation

## 2012-03-19 DIAGNOSIS — L02619 Cutaneous abscess of unspecified foot: Secondary | ICD-10-CM | POA: Insufficient documentation

## 2012-03-19 DIAGNOSIS — E785 Hyperlipidemia, unspecified: Secondary | ICD-10-CM | POA: Insufficient documentation

## 2012-03-19 DIAGNOSIS — L03039 Cellulitis of unspecified toe: Secondary | ICD-10-CM | POA: Insufficient documentation

## 2012-03-19 DIAGNOSIS — R0600 Dyspnea, unspecified: Secondary | ICD-10-CM

## 2012-03-19 DIAGNOSIS — R0602 Shortness of breath: Secondary | ICD-10-CM | POA: Insufficient documentation

## 2012-03-19 DIAGNOSIS — R05 Cough: Secondary | ICD-10-CM | POA: Insufficient documentation

## 2012-03-19 DIAGNOSIS — K219 Gastro-esophageal reflux disease without esophagitis: Secondary | ICD-10-CM | POA: Insufficient documentation

## 2012-03-19 DIAGNOSIS — R0609 Other forms of dyspnea: Secondary | ICD-10-CM | POA: Insufficient documentation

## 2012-03-19 DIAGNOSIS — I10 Essential (primary) hypertension: Secondary | ICD-10-CM | POA: Insufficient documentation

## 2012-03-19 DIAGNOSIS — Z79899 Other long term (current) drug therapy: Secondary | ICD-10-CM | POA: Insufficient documentation

## 2012-03-19 LAB — BASIC METABOLIC PANEL WITH GFR
BUN: 27 mg/dL — ABNORMAL HIGH (ref 6–23)
CO2: 28 meq/L (ref 19–32)
Calcium: 9.2 mg/dL (ref 8.4–10.5)
Chloride: 100 meq/L (ref 96–112)
Creatinine, Ser: 1.47 mg/dL — ABNORMAL HIGH (ref 0.50–1.10)
GFR calc Af Amer: 34 mL/min — ABNORMAL LOW
GFR calc non Af Amer: 29 mL/min — ABNORMAL LOW
Glucose, Bld: 134 mg/dL — ABNORMAL HIGH (ref 70–99)
Potassium: 4.8 meq/L (ref 3.5–5.1)
Sodium: 138 meq/L (ref 135–145)

## 2012-03-19 LAB — CBC WITH DIFFERENTIAL/PLATELET
HCT: 48.5 % — ABNORMAL HIGH (ref 36.0–46.0)
Hemoglobin: 16.5 g/dL — ABNORMAL HIGH (ref 12.0–15.0)
Lymphs Abs: 1.6 10*3/uL (ref 0.7–4.0)
MCH: 31.6 pg (ref 26.0–34.0)
Monocytes Absolute: 0.5 10*3/uL (ref 0.1–1.0)
Monocytes Relative: 6 % (ref 3–12)
Neutro Abs: 6.3 10*3/uL (ref 1.7–7.7)
Neutrophils Relative %: 74 % (ref 43–77)
RBC: 5.22 MIL/uL — ABNORMAL HIGH (ref 3.87–5.11)

## 2012-03-19 LAB — URINALYSIS, ROUTINE W REFLEX MICROSCOPIC
Glucose, UA: NEGATIVE mg/dL
Hgb urine dipstick: NEGATIVE
Specific Gravity, Urine: 1.02 (ref 1.005–1.030)
Urobilinogen, UA: 0.2 mg/dL (ref 0.0–1.0)
pH: 8 (ref 5.0–8.0)

## 2012-03-19 MED ORDER — CEPHALEXIN 500 MG PO CAPS: 500.0000 mg | ORAL_CAPSULE | Freq: Four times a day (QID) | ORAL | Status: DC

## 2012-03-19 NOTE — ED Notes (Signed)
Patient given copy of discharge paperwork; went over discharge instructions with patient and family.  Patient instructed to take Kelfex as directed and to finish entire prescription completely; instructed to follow up with primary care physician first thing Monday morning, and to return to the ED for new, worsening, or concerning symptoms.

## 2012-03-19 NOTE — ED Notes (Signed)
Pt presents to department for evaluation of generalized weakness, dizziness and SOB. Ongoing for several days. Pt lives at home by herself, normally uses cane at home without issues. States difficulty walking and states she becomes tired more easily. 2+ pitting edema noted bilateral lower extremities. Pt is alert and oriented x4. Respirations unlabored. Denies pain at the time.

## 2012-03-19 NOTE — ED Notes (Signed)
Dr. Rosalia Hammers now at bedside.

## 2012-03-19 NOTE — ED Notes (Signed)
Received bedside report from [place], [place].  Patient currently resting quietly in bed; no respiratory or acute distress noted.  Family members given water to drink; requesting cherry cola.  Family member informed that we do not have cherry cola, but we have regular coke.  Family member refused.  Patient and family member waiting on EDP/resident re-evaluation.  Denies any other needs at this time; will continue to monitor.

## 2012-03-19 NOTE — ED Provider Notes (Signed)
History     CSN: [id number]  Arrival date & time 03/19/12  1432   First MD Initiated Contact with Patient 03/19/12 1452      Chief Complaint  Patient presents with  . Shortness of Breath    (Consider location/radiation/quality/duration/timing/severity/associated sxs/prior treatment) HPI Comments: Pt has been wearing a splint on her 2nd R toe for hammer toe around the clock for weeks.  Today family noticed the foot was red and painful to the touch. She has had BLLE edema, but R greater than left.   Patient is a 79 y.o. female presenting with shortness of breath and lower extremity pain. The history is provided by the patient and a relative.  Shortness of Breath Severity:  Moderate Onset quality:  Gradual Timing:  Intermittent Progression:  Waxing and waning Chronicity:  Recurrent Relieved by:  Rest and sitting up Worsened by:  Exertion Associated symptoms: cough   Associated symptoms: no abdominal pain, no chest pain, no diaphoresis, no fever, no headaches, no neck pain, no sore throat, no vomiting and no wheezing   Risk factors: no hx of PE/DVT and no recent surgery   Foot Pain This is a new problem. The current episode started in the past 7 days. The problem occurs constantly. The problem has been gradually worsening. Associated symptoms include coughing. Pertinent negatives include no abdominal pain, arthralgias, chest pain, chills, congestion, diaphoresis, fatigue, fever, headaches, nausea, neck pain, numbness, sore throat, vomiting or weakness. The symptoms are aggravated by standing and walking. She has tried nothing for the symptoms. The treatment provided no relief.    Past Medical History  Diagnosis Date  . Hyperlipidemia, mild   . GERD (gastroesophageal reflux disease)   . Essential hypertension   . Chest pain     Past Surgical History  Procedure Laterality Date  . Appendectomy      Family History  Problem Relation Age of Onset  . Brain cancer Mother   .  Heart disease Father   . Heart attack Brother     History  Substance Use Topics  . Smoking status: Unknown If Ever Smoked  . Smokeless tobacco: Not on file  . Alcohol Use: No    OB History   Grav Para Term Preterm Abortions TAB SAB Ect Mult Living                  Review of Systems  Constitutional: Negative for fever, chills, diaphoresis, activity change, appetite change and fatigue.  HENT: Negative for congestion, sore throat, facial swelling, rhinorrhea, neck pain and neck stiffness.   Eyes: Negative for photophobia and discharge.  Respiratory: Positive for cough and shortness of breath. Negative for chest tightness and wheezing.   Cardiovascular: Negative for chest pain, palpitations and leg swelling.  Gastrointestinal: Negative for nausea, vomiting, abdominal pain and diarrhea.  Endocrine: Negative for polydipsia and polyuria.  Genitourinary: Negative for dysuria, frequency, difficulty urinating and pelvic pain.  Musculoskeletal: Negative for back pain and arthralgias.  Skin: Positive for wound. Negative for color change.  Allergic/Immunologic: Negative for immunocompromised state.  Neurological: Negative for facial asymmetry, weakness, numbness and headaches.  Hematological: Does not bruise/bleed easily.  Psychiatric/Behavioral: Negative for confusion and agitation.    Allergies  Review of patient's allergies indicates no known allergies.  Home Medications   Current Outpatient Rx  Name  Route  Sig  Dispense  Refill  . ALPRAZolam (XANAX) 0.5 MG tablet   Oral   Take 0.5 mg by mouth 2 (two) times daily.           Marland Kitchen  amLODipine (NORVASC) 10 MG tablet   Oral   Take 10 mg by mouth daily.           Marland Kitchen aspirin 81 MG tablet   Oral   Take 81 mg by mouth daily.          . bumetanide (BUMEX) 1 MG tablet   Oral   Take 1 mg by mouth daily.         . calcium-vitamin D (OSCAL WITH D) 500-200 MG-UNIT per tablet   Oral   Take 1 tablet by mouth 2 (two) times daily.            . cetirizine (ZYRTEC) 10 MG tablet   Oral   Take 10 mg by mouth daily as needed. For allergy symptoms         . colchicine 0.6 MG tablet   Oral   Take 0.6 mg by mouth daily as needed. For gout         . ferrous sulfate (IRON SUPPLEMENT) 325 (65 FE) MG tablet   Oral   Take 325 mg by mouth daily with breakfast.         . lisinopril (PRINIVIL,ZESTRIL) 10 MG tablet   Oral   Take 10 mg by mouth daily.         . Magnesium Hydroxide (MILK OF MAGNESIA PO)   Oral   Take 15-30 mLs by mouth daily.         . Multiple Vitamin (MULTIVITAMIN) tablet   Oral   Take 1 tablet by mouth daily.           . potassium chloride (K-DUR,KLOR-CON) 10 MEQ tablet   Oral   Take 20 mEq by mouth 2 (two) times daily.         . vitamin C (ASCORBIC ACID) 500 MG tablet   Oral   Take 500 mg by mouth daily.           . cephALEXin (KEFLEX) 500 MG capsule   Oral   Take 1 capsule (500 mg total) by mouth 4 (four) times daily.   28 capsule   0     BP 146/63  Pulse 77  Temp(Src) 98.2 F (36.8 C) (Oral)  Resp 20  SpO2 95%  Physical Exam  Constitutional: She is oriented to person, place, and time. She appears well-developed and well-nourished. No distress.  HENT:  Head: Normocephalic and atraumatic.  Mouth/Throat: No oropharyngeal exudate.  Eyes: Pupils are equal, round, and reactive to light.  Neck: Normal range of motion. Neck supple.  Cardiovascular: Normal rate, regular rhythm and normal heart sounds.  Exam reveals no gallop and no friction rub.   No murmur heard. Pulmonary/Chest: Effort normal and breath sounds normal. No respiratory distress. She has no wheezes. She has no rales.  Abdominal: Soft. Bowel sounds are normal. She exhibits no distension and no mass. There is no tenderness. There is no rebound and no guarding.  Musculoskeletal: Normal range of motion. She exhibits edema (BLLE edema, R>L). She exhibits no tenderness.       Right foot: She exhibits tenderness  (R second toe).       Feet:  Neurological: She is alert and oriented to person, place, and time.  Skin: Skin is warm and dry.  Psychiatric: She has a normal mood and affect.    ED Course  Procedures (including critical care time)  Labs Reviewed  CBC WITH DIFFERENTIAL - Abnormal; Notable for the following:    RBC 5.22 (*)  Hemoglobin 16.5 (*)    HCT 48.5 (*)    All other components within normal limits  BASIC METABOLIC PANEL - Abnormal; Notable for the following:    Glucose, Bld 134 (*)    BUN 27 (*)    Creatinine, Ser 1.47 (*)    GFR calc non Af Amer 29 (*)    GFR calc Af Amer 34 (*)    All other components within normal limits  URINE CULTURE  PRO B NATRIURETIC PEPTIDE  URINALYSIS, ROUTINE W REFLEX MICROSCOPIC  OCCULT BLOOD, POC DEVICE   Dg Chest 2 View  03/19/2012  *RADIOLOGY REPORT*  Clinical Data: Short of breath and weakness  CHEST - 2 VIEW  Comparison: 02/02/2010  Findings: There is a very large hiatal hernia, probably containing most if not all of the stomach.  This has some compressive effect on the left lower lung.  Findings appear chronic.  The lungs are otherwise clear.  The vascularity is normal.  No effusions.  No acute bony findings.  IMPRESSION: Chronic, very large hiatal hernia.  No active cardiopulmonary disease otherwise.   Original Report Authenticated By: Paulina Fusi, M.D.    Dg Foot Complete Right  03/19/2012  *RADIOLOGY REPORT*  Clinical Data: Pain of the second toe  RIGHT FOOT COMPLETE - 3+ VIEW  Comparison: None.  Findings: The bones are osteopenic.  There are flexion deformities of the second through fifth toes.  I do not see a fracture.  There is no evidence of osteomyelitis or other focal lesion.  Mild hallux valgus deformity is present.  There appears to be dorsal soft tissue swelling of the forefoot.  There are ordinary mild degenerative changes of the midfoot.  IMPRESSION: Forefoot soft tissue swelling.  No acute bony finding.  Hallux valgus deformity.   Osteopenia.  Flexion deformities of the toes.   Original Report Authenticated By: Paulina Fusi, M.D.      1. Hiatal hernia   2. Dyspnea   3. Cellulitis, toe, right     Date: 03/19/2012  Rate: 80  Rhythm: normal sinus rhythm  QRS Axis: left  Intervals: normal  ST/T Wave abnormalities: normal  Conduction Disutrbances:left anterior fascicular block  Narrative Interpretation: Q waves V3-6  Old EKG Reviewed: no changes noted      MDM  Pt is a 79 y.o. female with pertinent PMHX of GERD, HTN, HLD, MI? who presents with several weeks of DOE, fatigue, generalized weakness, BLLE edema with R>L and pain in R foot.  Pt reports she has been wearing a prosthetic for hammer toe all day except when bathing and family today noticed her second toe was red and swollen. No known fever,s CP, cough, ab pain.  Pt 98% on RA.  Lungs clear.  She has BL pitting edema on extremities w/ likely cellulitis of 2nd R toe.  CBC, BMP, BNP, trop, CXR ordered w/ findings of large hiatal hernia w/ compression of lung which I believe may be causing her SOB.  BNP not significantly elevated, Hb stable, urine does not appear infected.  Cr near baseline over bast 7 months w/ range from 1-1.4.  I doubt PE given no CP, hypoxia.  I believe she is safe to f/u with PCP regarding this hiatal hernia and for recheck of foot on Monday.  Return precautions given for new or worsening symptoms.     1. Hiatal hernia   2. Dyspnea   3. Cellulitis, toe, right      Labs and imaging considered in decision making,  reviewed by myself.  Imaging interpreted by radiology. Pt care discussed with my attending, Dr. Rosalia Hammers.         Toy Cookey, MD 03/20/12 [phone]

## 2012-03-19 NOTE — ED Notes (Signed)
Attempted to collect urine, pt unable to void.  

## 2012-03-19 NOTE — ED Notes (Signed)
Sob for a while for a month  Saw the ddr on Tuesday was told her  Blood was low has been tired .  But cannot remember if she told dr or not

## 2012-03-19 NOTE — ED Notes (Signed)
Also  have foot pain rt

## 2012-03-20 NOTE — ED Provider Notes (Signed)
Elderly female with complaint of red second toe after wearing splint.  Toe red and tender.  X-[name] with out signs of osteo.  Patient without fever.  Some ttp of toe and foot.  Advised to stop wearing splint and will rx with abx and recheck wih pmd  I performed a history and physical examination of [name] and discussed her management with Dr. Micheline Maze.  I agree with the history, physical, assessment, and plan of care, with the following exceptions: None  I was present for the following procedures: None Time Spent in Critical Care of the patient: None Time spent in discussions with the patient and family: 20  Envi Eagleson S    Hilario Quarry, MD 03/20/12 [phone]

## 2012-03-21 LAB — URINE CULTURE
Colony Count: NO GROWTH
Culture: NO GROWTH

## 2012-07-18 ENCOUNTER — Other Ambulatory Visit (HOSPITAL_COMMUNITY): Payer: Self-pay | Admitting: Family Medicine

## 2012-07-18 ENCOUNTER — Ambulatory Visit (HOSPITAL_COMMUNITY)
Admission: RE | Admit: 2012-07-18 | Discharge: 2012-07-18 | Disposition: A | Discharge status: Home / Self Care | Payer: Medicare Other | Source: Ambulatory Visit | Attending: Family Medicine | Admitting: Family Medicine

## 2012-07-18 DIAGNOSIS — R0602 Shortness of breath: Secondary | ICD-10-CM

## 2012-07-18 DIAGNOSIS — I771 Stricture of artery: Secondary | ICD-10-CM | POA: Insufficient documentation

## 2012-07-18 DIAGNOSIS — K449 Diaphragmatic hernia without obstruction or gangrene: Secondary | ICD-10-CM | POA: Insufficient documentation

## 2012-07-18 DIAGNOSIS — I700 Atherosclerosis of aorta: Secondary | ICD-10-CM | POA: Insufficient documentation

## 2012-07-18 DIAGNOSIS — I517 Cardiomegaly: Secondary | ICD-10-CM | POA: Insufficient documentation

## 2012-07-29 ENCOUNTER — Emergency Department (HOSPITAL_COMMUNITY): Payer: Medicare Other

## 2012-07-29 ENCOUNTER — Inpatient Hospital Stay (HOSPITAL_COMMUNITY)
Admission: EM | Admit: 2012-07-29 | Discharge: 2012-08-03 | DRG: 175 | Disposition: A | Discharge status: Home Health | Payer: Medicare Other | Attending: Family Medicine | Admitting: Family Medicine

## 2012-07-29 ENCOUNTER — Encounter (HOSPITAL_COMMUNITY): Payer: Self-pay

## 2012-07-29 DIAGNOSIS — I2699 Other pulmonary embolism without acute cor pulmonale: Principal | ICD-10-CM | POA: Diagnosis present

## 2012-07-29 DIAGNOSIS — K219 Gastro-esophageal reflux disease without esophagitis: Secondary | ICD-10-CM | POA: Diagnosis present

## 2012-07-29 DIAGNOSIS — Z66 Do not resuscitate: Secondary | ICD-10-CM | POA: Diagnosis present

## 2012-07-29 DIAGNOSIS — I129 Hypertensive chronic kidney disease with stage 1 through stage 4 chronic kidney disease, or unspecified chronic kidney disease: Secondary | ICD-10-CM | POA: Diagnosis present

## 2012-07-29 DIAGNOSIS — E785 Hyperlipidemia, unspecified: Secondary | ICD-10-CM | POA: Diagnosis present

## 2012-07-29 DIAGNOSIS — R0602 Shortness of breath: Secondary | ICD-10-CM | POA: Diagnosis present

## 2012-07-29 DIAGNOSIS — I10 Essential (primary) hypertension: Secondary | ICD-10-CM | POA: Diagnosis present

## 2012-07-29 DIAGNOSIS — N183 Chronic kidney disease, stage 3 unspecified: Secondary | ICD-10-CM | POA: Diagnosis present

## 2012-07-29 DIAGNOSIS — J9600 Acute respiratory failure, unspecified whether with hypoxia or hypercapnia: Secondary | ICD-10-CM | POA: Diagnosis present

## 2012-07-29 DIAGNOSIS — K449 Diaphragmatic hernia without obstruction or gangrene: Secondary | ICD-10-CM | POA: Diagnosis present

## 2012-07-29 DIAGNOSIS — R609 Edema, unspecified: Secondary | ICD-10-CM | POA: Diagnosis present

## 2012-07-29 LAB — CBC WITH DIFFERENTIAL/PLATELET
Basophils Relative: 0 % (ref 0–1)
Eosinophils Absolute: 0.4 10*3/uL (ref 0.0–0.7)
Eosinophils Relative: 5 % (ref 0–5)
Hemoglobin: 16.3 g/dL — ABNORMAL HIGH (ref 12.0–15.0)
Lymphs Abs: 1.8 10*3/uL (ref 0.7–4.0)
MCH: 31.5 pg (ref 26.0–34.0)
MCHC: 33.6 g/dL (ref 30.0–36.0)
MCV: 93.6 fL (ref 78.0–100.0)
Monocytes Relative: 9 % (ref 3–12)
Neutrophils Relative %: 60 % (ref 43–77)
RBC: 5.18 MIL/uL — ABNORMAL HIGH (ref 3.87–5.11)

## 2012-07-29 LAB — URINALYSIS, ROUTINE W REFLEX MICROSCOPIC
Bilirubin Urine: NEGATIVE
Leukocytes, UA: NEGATIVE
Nitrite: NEGATIVE
Specific Gravity, Urine: 1.02 (ref 1.005–1.030)
Urobilinogen, UA: 0.2 mg/dL (ref 0.0–1.0)
pH: 7 (ref 5.0–8.0)

## 2012-07-29 LAB — COMPREHENSIVE METABOLIC PANEL
Albumin: 3.4 g/dL — ABNORMAL LOW (ref 3.5–5.2)
Alkaline Phosphatase: 59 U/L (ref 39–117)
BUN: 25 mg/dL — ABNORMAL HIGH (ref 6–23)
Calcium: 9.1 mg/dL (ref 8.4–10.5)
Creatinine, Ser: 1.42 mg/dL — ABNORMAL HIGH (ref 0.50–1.10)
GFR calc Af Amer: 35 mL/min — ABNORMAL LOW (ref >90)
Glucose, Bld: 103 mg/dL — ABNORMAL HIGH (ref 70–99)
Total Protein: 6.8 g/dL (ref 6.0–8.3)

## 2012-07-29 LAB — TROPONIN I: Troponin I: <0.3 ng/mL (ref <0.30)

## 2012-07-29 LAB — PRO B NATRIURETIC PEPTIDE: Pro B Natriuretic peptide (BNP): 427.6 pg/mL (ref 0–450)

## 2012-07-29 MED ORDER — SODIUM CHLORIDE 0.9 % IV SOLN: 250.0000 mL | INTRAVENOUS | Status: DC | PRN

## 2012-07-29 MED ORDER — ACETAMINOPHEN 325 MG PO TABS: 650.0000 mg | ORAL_TABLET | Freq: Four times a day (QID) | ORAL | Status: DC | PRN

## 2012-07-29 MED ORDER — AMLODIPINE BESYLATE 5 MG PO TABS
10.0000 mg | ORAL_TABLET | Freq: Every day | ORAL | Status: DC
  Administered 2012-07-29 – 2012-08-03 (×6): 10 mg via ORAL
  Filled 2012-07-29 (×6): qty 2

## 2012-07-29 MED ORDER — SODIUM CHLORIDE 0.9 % IJ SOLN
3.0000 mL | Freq: Two times a day (BID) | INTRAMUSCULAR | Status: DC
  Administered 2012-07-29 – 2012-08-02 (×6): 3 mL via INTRAVENOUS

## 2012-07-29 MED ORDER — CALCIUM CARBONATE-VITAMIN D 500-200 MG-UNIT PO TABS
1.0000 | ORAL_TABLET | Freq: Two times a day (BID) | ORAL | Status: DC
  Administered 2012-07-29 – 2012-08-03 (×10): 1 via ORAL
  Filled 2012-07-29 (×10): qty 1

## 2012-07-29 MED ORDER — ACETAMINOPHEN 650 MG RE SUPP: 650.0000 mg | Freq: Four times a day (QID) | RECTAL | Status: DC | PRN

## 2012-07-29 MED ORDER — ENOXAPARIN SODIUM 80 MG/0.8ML ~~LOC~~ SOLN
1.0000 mg/kg | Freq: Once | SUBCUTANEOUS | Status: AC
  Administered 2012-07-29: 80 mg via SUBCUTANEOUS
  Filled 2012-07-29: qty 0.8

## 2012-07-29 MED ORDER — ENOXAPARIN SODIUM 80 MG/0.8ML ~~LOC~~ SOLN: 1.0000 mg/kg | Freq: Two times a day (BID) | SUBCUTANEOUS | Status: DC

## 2012-07-29 MED ORDER — SODIUM CHLORIDE 0.9 % IJ SOLN
3.0000 mL | Freq: Two times a day (BID) | INTRAMUSCULAR | Status: DC
  Administered 2012-07-30 – 2012-07-31 (×2): 3 mL via INTRAVENOUS

## 2012-07-29 MED ORDER — FERROUS SULFATE 325 (65 FE) MG PO TABS
325.0000 mg | ORAL_TABLET | Freq: Every day | ORAL | Status: DC
  Administered 2012-07-30 – 2012-08-03 (×5): 325 mg via ORAL
  Filled 2012-07-29 (×5): qty 1

## 2012-07-29 MED ORDER — PANTOPRAZOLE SODIUM 40 MG PO TBEC
40.0000 mg | DELAYED_RELEASE_TABLET | Freq: Every day | ORAL | Status: DC
  Administered 2012-07-29 – 2012-08-03 (×6): 40 mg via ORAL
  Filled 2012-07-29 (×6): qty 1

## 2012-07-29 MED ORDER — SODIUM CHLORIDE 0.9 % IJ SOLN: 3.0000 mL | INTRAMUSCULAR | Status: DC | PRN

## 2012-07-29 MED ORDER — IOHEXOL 350 MG/ML SOLN
80.0000 mL | Freq: Once | INTRAVENOUS | Status: AC | PRN
  Administered 2012-07-29: 80 mL via INTRAVENOUS

## 2012-07-29 MED ORDER — BUMETANIDE 1 MG PO TABS
1.0000 mg | ORAL_TABLET | Freq: Every day | ORAL | Status: DC
  Administered 2012-07-29 – 2012-08-03 (×6): 1 mg via ORAL
  Filled 2012-07-29 (×6): qty 1

## 2012-07-29 MED ORDER — ONDANSETRON HCL 4 MG PO TABS: 4.0000 mg | ORAL_TABLET | Freq: Four times a day (QID) | ORAL | Status: DC | PRN

## 2012-07-29 MED ORDER — ALPRAZOLAM 0.5 MG PO TABS
0.5000 mg | ORAL_TABLET | Freq: Two times a day (BID) | ORAL | Status: DC
  Administered 2012-07-29 – 2012-08-03 (×10): 0.5 mg via ORAL
  Filled 2012-07-29 (×10): qty 1

## 2012-07-29 MED ORDER — ONDANSETRON HCL 4 MG/2ML IJ SOLN: 4.0000 mg | Freq: Four times a day (QID) | INTRAMUSCULAR | Status: DC | PRN

## 2012-07-29 MED ORDER — ASPIRIN 81 MG PO CHEW
81.0000 mg | CHEWABLE_TABLET | Freq: Every day | ORAL | Status: DC
  Administered 2012-07-29 – 2012-08-03 (×6): 81 mg via ORAL
  Filled 2012-07-29 (×6): qty 1

## 2012-07-29 NOTE — ED Notes (Signed)
Beeped to [phone].Memon

## 2012-07-29 NOTE — Progress Notes (Signed)
[facility] History and Physical  JINI HORIUCHI WUJ:[id number] DOB: 03/06/1915 DOA: 07/29/2012  Referring physician: Dr. Estell Harpin, ER physician PCP: Milana Obey, MD  Specialists:   Chief Complaint: shortness of breath  HPI: [name] is a 79 y.o. female who presents to the emergency room with shortness of breath. Patient describes onset of symptoms approximately a week ago. She does report intermittent chest pain in the substernal area it does not feel the symptoms are significant. Her shortness of breath is worse with exertion. She was previously on oxygen approximately a year ago, but this was discontinued as she felt she did not need it anymore. She's not had any recent surgeries, prolonged immobilization, recent plane/long car rides. She does not have a prior history of VTE. She's had any fever, cough, nausea vomiting, diarrhea, abdominal pain, dysuria or any other complaints. She was evaluated in the emergency room and CT image of the chest revealed right-sided pulmonary emboli. She has been referred for admission.  Review of Systems: Pertinent positives as per HPI, otherwise negative  Past Medical History  Diagnosis Date  . Hyperlipidemia, mild   . GERD (gastroesophageal reflux disease)   . Essential hypertension   . Chest pain    Past Surgical History  Procedure Laterality Date  . Appendectomy     Social History:  reports that she does not drink alcohol or use illicit drugs. Her tobacco history is not on file.   No Known Allergies  Family History  Problem Relation Age of Onset  . Brain cancer Mother   . Heart disease Father   . Heart attack Brother     Prior to Admission medications   Medication Sig Start Date End Date Taking? Authorizing Provider  ALPRAZolam Prudy Feeler) 0.5 MG tablet Take 0.5 mg by mouth 2 (two) times daily.     Yes Historical Provider, MD  amLODipine (NORVASC) 10 MG tablet Take 10 mg by mouth daily.     Yes Historical Provider, MD   aspirin 81 MG tablet Take 81 mg by mouth daily.    Yes Historical Provider, MD  bumetanide (BUMEX) 1 MG tablet Take 1 mg by mouth daily.   Yes Historical Provider, MD  calcium-vitamin D (OSCAL WITH D) 500-200 MG-UNIT per tablet Take 1 tablet by mouth 2 (two) times daily.    Yes Historical Provider, MD  cetirizine (ZYRTEC) 10 MG tablet Take 10 mg by mouth daily. For allergy symptoms   Yes Historical Provider, MD  ferrous sulfate (IRON SUPPLEMENT) 325 (65 FE) MG tablet Take 325 mg by mouth daily with breakfast.   Yes Historical Provider, MD  Multiple Vitamin (MULTIVITAMIN) tablet Take 1 tablet by mouth daily.     Yes Historical Provider, MD  potassium chloride (K-DUR,KLOR-CON) 10 MEQ tablet Take 20 mEq by mouth 2 (two) times daily.   Yes Historical Provider, MD  vitamin C (ASCORBIC ACID) 500 MG tablet Take 500 mg by mouth daily.     Yes Historical Provider, MD  colchicine 0.6 MG tablet Take 0.6 mg by mouth daily as needed. For gout    Historical Provider, MD   Physical Exam: Filed Vitals:   07/29/12 1359 07/29/12 1613 07/29/12 1721 07/29/12 1752  BP:   152/97   Pulse:  66 74   Temp:   97.6 F (36.4 C)   TempSrc:      Resp:  11 18   Height:    5\' 6"  (1.676 m)  Weight:    81.647 kg (180 lb)  SpO2: 96% 97% 97%      General:  NAD  Eyes: Pupils are equal round react to light  ENT: Mucous membranes are moist  Neck: Supple  Cardiovascular: S1, S2, regular rate and rhythm  Respiratory: Coarse crackles bilaterally  Abdomen: Soft, nontender, positive bowel sounds  Skin: No rashes  Musculoskeletal: One plus edema lower extremities bilaterally  Psychiatric: Normal affect, cooperative with exam, pleasant  Neurologic: Grossly intact, nonfocal  Labs on Admission:  Basic Metabolic Panel:  Recent Labs Lab 07/29/12 1421  NA 138  K 4.4  CL 99  CO2 32  GLUCOSE 103*  BUN 25*  CREATININE 1.42*  CALCIUM 9.1   Liver Function Tests:  Recent Labs Lab 07/29/12 1421  AST 27   ALT 13  ALKPHOS 59  BILITOT 0.7  PROT 6.8  ALBUMIN 3.4*   No results found for this basename: LIPASE, AMYLASE,  in the last 168 hours No results found for this basename: AMMONIA,  in the last 168 hours CBC:  Recent Labs Lab 07/29/12 1421  WBC 6.9  NEUTROABS 4.1  HGB 16.3*  HCT 48.5*  MCV 93.6  PLT 259   Cardiac Enzymes:  Recent Labs Lab 07/29/12 1421  TROPONINI <0.30    BNP (last 3 results)  Recent Labs  03/19/12 1538 07/29/12 1421  PROBNP 445.4 427.6   CBG: No results found for this basename: GLUCAP,  in the last 168 hours  Radiological Exams on Admission: Ct Angio Chest Pe W/cm &/or Wo Cm  07/29/2012   *RADIOLOGY REPORT*  Clinical Data: Shortness of breath.  CT ANGIOGRAPHY CHEST  Technique:  Multidetector CT imaging of the chest using the standard protocol during bolus administration of intravenous contrast. Multiplanar reconstructed images including MIPs were obtained and reviewed to evaluate the vascular anatomy.  Contrast: 80mL OMNIPAQUE IOHEXOL 350 MG/ML SOLN  Comparison: Chest x-ray dated 07/29/2012 and CT scan of the chest dated 09/25/2008  Findings: There are small pulmonary emboli in branches of the right upper lobe pulmonary artery as well as a tiny embolus in one of the distal right lower lobe.  There are no infiltrates or effusions. Chronic cardiomegaly.  Chronic huge hiatal hernia with chronic compressive atelectasis of the left lung.  The hiatal hernia contains most of the stomach as well as a loop of the transverse colon.  No acute osseous abnormality.  Multiple areas of prominent atherosclerotic disease in the thoracic aorta.  IMPRESSION:  1.  Small pulmonary emboli in the right lung. 2.  No other acute abnormality.   Original Report Authenticated By: Francene Boyers, M.D.   Dg Chest Port 1 View  07/29/2012   *RADIOLOGY REPORT*  Clinical Data: Shortness of breath and weakness  PORTABLE CHEST - 1 VIEW  Comparison: 07/18/2012  Findings: There is a normal heart  size.  Lung volumes are low. Very large hiatal hernia obscures much of the left heart border. No airspace consolidation noted.  IMPRESSION:  1.  Very large hiatal hernia. 2.  Low lung volumes.   Original Report Authenticated By: Signa Kell, M.D.    EKG: Not available for review  Assessment/Plan Principal Problem:   Pulmonary emboli Active Problems:   Unspecified essential hypertension   GASTROESOPHAGEAL REFLUX DISEASE   HIATAL HERNIA   DYSPNEA   Acute respiratory failure   1. Right-sided pulmonary emboli. Patient does not have any known risk factors for venous thromboembolism. She does not have prior history of VTE. We'll start the patient off Lovenox. I have discussed with the  patient and her son risks/ benefits of using Coumadin versus one of the newer agents. Coumadin may be a better option for this patient since if she does bleed, her coagulopathy may be easily reversed. With her advanced age and frailty, she certainly is a fall risk. Would recommend 6 months of therapy. Coumadin versus Xarelto may be started tomorrow after further discussion with the patient/family. We'll check lower extremity venous Dopplers to rule out underlying DVT. Ordered a 2-D echocardiogram. She may very well need to be discharged on oxygen again. 2. Pedal edema. This is reportedly a chronic problem. The patient does take Bumex daily. We will continue this medication. 3. Chronic kidney disease stage III. Renal function appears to be near baseline when compared to lab values from 02/2012. 4. Hypertension. Continue outpatient regimen 5. GERD/hiatal hernia. We'll start the patient on Protonix. She does complain of chronic hoarseness of her voice. This may be related to chronic reflux.  Patient will be admitted to the service of Dr. Sudie Bailey. [facility] will be available for any issues until 7AM on 07/5.  Code Status: DNR, confirmed with patient Family Communication: discussed with patient and son at the  bedside Disposition Plan: discharge home once improved  Time spent:  Devone Bonilla [facility] Pager [phone]  If 7PM-7AM, please contact night-coverage [email or web address] Password [facility] 07/29/2012, 6:22 PM

## 2012-07-29 NOTE — ED Notes (Signed)
Pt arrived from ome by ems, she has been sob for the last 3-4 days, has been on home 02 but stated that it had been taken away from her.

## 2012-07-29 NOTE — ED Provider Notes (Signed)
History  This chart was scribed for Benny Lennert, MD by Bennett Scrape, ED Scribe. This patient was seen in room APA06/[facility]06 and the patient's care was started at 2:02 PM.  CSN: [id number]  Arrival date & time 07/29/12  1400   First MD Initiated Contact with Patient 07/29/12 1402     Chief Complaint  Patient presents with  . Shortness of Breath   Level 5 Caveat-AMS-Mild confusion  The history is provided by the patient. No language interpreter was used.    HPI Comments: [name] is a 79 y.o. female brought in by ambulance, who presents to the Emergency Department complaining of 3 to 4 days of SOB with associated bilateral ankle swelling. She states that she has been off of her O2 for the past year. She denies having any CP. She ambulates with a walker and denies changes.  PCP is Dr. Sudie Bailey.  Past Medical History  Diagnosis Date  . Hyperlipidemia, mild   . GERD (gastroesophageal reflux disease)   . Essential hypertension   . Chest pain    Past Surgical History  Procedure Laterality Date  . Appendectomy     Family History  Problem Relation Age of Onset  . Brain cancer Mother   . Heart disease Father   . Heart attack Brother    History  Substance Use Topics  . Smoking status: Unknown If Ever Smoked  . Smokeless tobacco: Not on file  . Alcohol Use: No   No OB history provided.   Review of Systems  Unable to perform ROS: Mental status change    Allergies  Review of patient's allergies indicates no known allergies.  Home Medications   Current Outpatient Rx  Name  Route  Sig  Dispense  Refill  . ALPRAZolam (XANAX) 0.5 MG tablet   Oral   Take 0.5 mg by mouth 2 (two) times daily.           Marland Kitchen amLODipine (NORVASC) 10 MG tablet   Oral   Take 10 mg by mouth daily.           Marland Kitchen aspirin 81 MG tablet   Oral   Take 81 mg by mouth daily.          . bumetanide (BUMEX) 1 MG tablet   Oral   Take 1 mg by mouth daily.         . calcium-vitamin  D (OSCAL WITH D) 500-200 MG-UNIT per tablet   Oral   Take 1 tablet by mouth 2 (two) times daily.          . cephALEXin (KEFLEX) 500 MG capsule   Oral   Take 1 capsule (500 mg total) by mouth 4 (four) times daily.   28 capsule   0   . cetirizine (ZYRTEC) 10 MG tablet   Oral   Take 10 mg by mouth daily as needed. For allergy symptoms         . colchicine 0.6 MG tablet   Oral   Take 0.6 mg by mouth daily as needed. For gout         . ferrous sulfate (IRON SUPPLEMENT) 325 (65 FE) MG tablet   Oral   Take 325 mg by mouth daily with breakfast.         . lisinopril (PRINIVIL,ZESTRIL) 10 MG tablet   Oral   Take 10 mg by mouth daily.         . Magnesium Hydroxide (MILK OF MAGNESIA PO)  Oral   Take 15-30 mLs by mouth daily.         . Multiple Vitamin (MULTIVITAMIN) tablet   Oral   Take 1 tablet by mouth daily.           . potassium chloride (K-DUR,KLOR-CON) 10 MEQ tablet   Oral   Take 20 mEq by mouth 2 (two) times daily.         . vitamin C (ASCORBIC ACID) 500 MG tablet   Oral   Take 500 mg by mouth daily.            Triage Vitals: BP 155/66  Pulse 68  Temp(Src) 98 F (36.7 C) (Oral)  Resp 20  SpO2 96%  Physical Exam  Nursing note and vitals reviewed. Constitutional: She appears well-developed and well-nourished.  Lethargic and mildly confused  HENT:  Head: Normocephalic and atraumatic.  Dry MM  Eyes: Conjunctivae and EOM are normal. No scleral icterus.  Neck: Neck supple. No thyromegaly present.  Cardiovascular: Normal rate and regular rhythm.  Exam reveals no gallop and no friction rub.   No murmur heard. Pulmonary/Chest: Effort normal. No stridor. She has no wheezes. She has no rales. She exhibits no tenderness.  Abdominal: She exhibits no distension. There is no tenderness. There is no rebound.  Musculoskeletal: Normal range of motion. She exhibits no edema.  Lymphadenopathy:    She has no cervical adenopathy.  Neurological: Coordination  normal.  Mildly confused and lethargic  Skin: No rash noted. No erythema.  Psychiatric: She has a normal mood and affect. Her behavior is normal.    ED Course  Procedures (including critical care time)  DIAGNOSTIC STUDIES: Oxygen Saturation is 96% on [place], normal by my interpretation.    COORDINATION OF CARE: 2:09 PM-Discussed treatment plan which includes  (CXR, CBC panel, CMP, UA) with pt at bedside and pt agreed to plan.   Labs Reviewed - No data to display No results found.  No diagnosis found.  MDM   Date: 07/29/2012  Rate:65  Rhythm: normal sinus rhythm  QRS Axis: left  Intervals: normal  ST/T Wave abnormalities: nonspecific ST changes  Conduction Disutrbances:none  Narrative Interpretation:   Old EKG Reviewed: none available   The chart was scribed for me under my direct supervision.  I personally performed the history, physical, and medical decision making and all procedures in the evaluation of this patient.Benny Lennert, MD 07/29/12 [phone]

## 2012-07-30 ENCOUNTER — Inpatient Hospital Stay (HOSPITAL_COMMUNITY): Payer: Medicare Other

## 2012-07-30 LAB — BASIC METABOLIC PANEL
BUN: 24 mg/dL — ABNORMAL HIGH (ref 6–23)
CO2: 33 mEq/L — ABNORMAL HIGH (ref 19–32)
Calcium: 9.6 mg/dL (ref 8.4–10.5)
Creatinine, Ser: 1.44 mg/dL — ABNORMAL HIGH (ref 0.50–1.10)
Glucose, Bld: 98 mg/dL (ref 70–99)

## 2012-07-30 LAB — CBC
MCH: 30.9 pg (ref 26.0–34.0)
MCHC: 33.9 g/dL (ref 30.0–36.0)
MCV: 91.1 fL (ref 78.0–100.0)
Platelets: 272 10*3/uL (ref 150–400)
RDW: 12.9 % (ref 11.5–15.5)

## 2012-07-30 LAB — GLUCOSE, CAPILLARY

## 2012-07-30 MED ORDER — WARFARIN SODIUM 5 MG PO TABS
5.0000 mg | ORAL_TABLET | Freq: Once | ORAL | Status: AC
  Administered 2012-07-30: 5 mg via ORAL
  Filled 2012-07-30: qty 1

## 2012-07-30 MED ORDER — ENOXAPARIN SODIUM 100 MG/ML ~~LOC~~ SOLN
1.0000 mg/kg | SUBCUTANEOUS | Status: DC
  Administered 2012-07-30 – 2012-08-02 (×4): 85 mg via SUBCUTANEOUS
  Filled 2012-07-30 (×4): qty 1

## 2012-07-30 MED ORDER — WARFARIN - PHARMACIST DOSING INPATIENT: Status: DC

## 2012-07-30 MED ORDER — WARFARIN VIDEO
Freq: Once | Status: AC
  Administered 2012-07-30: 1

## 2012-07-30 MED ORDER — COUMADIN BOOK
Freq: Once | Status: AC
  Administered 2012-07-30: 1
  Filled 2012-07-30: qty 1

## 2012-07-30 NOTE — Progress Notes (Signed)
ANTICOAGULATION CONSULT NOTE   Pharmacy Consult for Lovenox - Warfarin Indication: pulmonary embolus   No Known Allergies  Patient Measurements: Height: 5\' 6"  (167.6 cm) Weight: 190 lb 14.7 oz (86.6 kg) IBW/kg (Calculated) : 59.3  Vital Signs: Temp: 97.3 F (36.3 C) (07/05 0532) Temp src: Oral (07/05 0532) BP: 155/64 mmHg (07/05 0532) Pulse Rate: 72 (07/05 0532)  Labs:  Recent Labs  07/29/12 1421 07/30/12 0537 07/30/12 0810  HGB 16.3* 16.4*  --   HCT 48.5* 48.4*  --   PLT 259 272  --   LABPROT  --   --  13.3  INR  --   --  1.03  CREATININE 1.42* 1.44*  --   TROPONINI <0.30  --   --     Estimated Creatinine Clearance: 25.9 ml/min (by C-G formula based on Cr of 1.44).   Medical History: Past Medical History  Diagnosis Date  . Hyperlipidemia, mild   . GERD (gastroesophageal reflux disease)   . Essential hypertension   . Chest pain     Medications:  Scheduled:  . ALPRAZolam  0.5 mg Oral BID  . amLODipine  10 mg Oral Daily  . aspirin  81 mg Oral Daily  . bumetanide  1 mg Oral Daily  . calcium-vitamin D  1 tablet Oral BID  . coumadin book   Does not apply Once  . enoxaparin (LOVENOX) injection  1 mg/kg Subcutaneous Q24H  . ferrous sulfate  325 mg Oral Q breakfast  . pantoprazole  40 mg Oral Daily  . sodium chloride  3 mL Intravenous Q12H  . sodium chloride  3 mL Intravenous Q12H  . warfarin   Does not apply Once    Assessment: Okay for Protocol Estimated Creatinine Clearance: 25.9 ml/min (by C-G formula based on Cr of 1.44).   Goal of Therapy:  Anti-Xa level 0.6-1.2 units/ml 4hrs after LMWH dose given if checked. Monitor platelets by anticoagulation protocol: Yes   Plan:  Lovenox 1mg /kg SQ every 24 hours (adjusted for renal function). CBC at least every 3 days while on Lovenox. Await long term anticoagulation plan.  NOTE:  Current renal function would not allow for treatment with newer anticoagulant options (i.e. Xarelto or Pradaxa).  Addendum:   Added warfarin per pharmacy.  Education materials, labs and Warfarin dosing initiated.  Goal INR 2-3.   Lamonte Richer R 07/30/2012,12:15 PM

## 2012-07-30 NOTE — Progress Notes (Signed)
[id number] 

## 2012-07-30 NOTE — Progress Notes (Signed)
ANTICOAGULATION CONSULT NOTE  Pharmacy Consult for Lovenox Indication: pulmonary embolus  No Known Allergies  Patient Measurements: Height: 5\' 6"  (167.6 cm) Weight: 190 lb 14.7 oz (86.6 kg) IBW/kg (Calculated) : 59.3  Vital Signs: Temp: 97.3 F (36.3 C) (07/05 0532) Temp src: Oral (07/05 0532) BP: 155/64 mmHg (07/05 0532) Pulse Rate: 72 (07/05 0532)  Labs:  Recent Labs  07/29/12 1421 07/30/12 0537  HGB 16.3* 16.4*  HCT 48.5* 48.4*  PLT 259 272  CREATININE 1.42* 1.44*  TROPONINI <0.30  --     Estimated Creatinine Clearance: 25.9 ml/min (by C-G formula based on Cr of 1.44).   Medical History: Past Medical History  Diagnosis Date  . Hyperlipidemia, mild   . GERD (gastroesophageal reflux disease)   . Essential hypertension   . Chest pain     Medications:  Scheduled:  . ALPRAZolam  0.5 mg Oral BID  . amLODipine  10 mg Oral Daily  . aspirin  81 mg Oral Daily  . bumetanide  1 mg Oral Daily  . calcium-vitamin D  1 tablet Oral BID  . enoxaparin (LOVENOX) injection  1 mg/kg Subcutaneous Q24H  . ferrous sulfate  325 mg Oral Q breakfast  . pantoprazole  40 mg Oral Daily  . sodium chloride  3 mL Intravenous Q12H  . sodium chloride  3 mL Intravenous Q12H    Assessment: Okay for Protocol Estimated Creatinine Clearance: 25.9 ml/min (by C-G formula based on Cr of 1.44).   Goal of Therapy:  Anti-Xa level 0.6-1.2 units/ml 4hrs after LMWH dose given if checked. Monitor platelets by anticoagulation protocol: Yes   Plan:  Lovenox 1mg /kg SQ every 24 hours (adjusted for renal function). CBC at least every 3 days while on Lovenox. Await long term anticoagulation plan.  NOTE:  Current renal function would not allow for treatment with newer anticoagulant options (i.e. Xarelto or Pradaxa).  Lamonte Richer R 07/30/2012,7:40 AM

## 2012-07-30 NOTE — Progress Notes (Signed)
[facility] notified of the pt discharge.

## 2012-07-31 LAB — CBC
MCH: 31.1 pg (ref 26.0–34.0)
MCV: 93.8 fL (ref 78.0–100.0)
Platelets: 264 10*3/uL (ref 150–400)
RBC: 5.01 MIL/uL (ref 3.87–5.11)
RDW: 12.8 % (ref 11.5–15.5)

## 2012-07-31 LAB — GLUCOSE, CAPILLARY
Glucose-Capillary: 96 mg/dL (ref 70–99)
Glucose-Capillary: 98 mg/dL (ref 70–99)

## 2012-07-31 MED ORDER — WARFARIN SODIUM 5 MG PO TABS
5.0000 mg | ORAL_TABLET | Freq: Once | ORAL | Status: AC
  Administered 2012-07-31: 5 mg via ORAL
  Filled 2012-07-31: qty 1

## 2012-07-31 NOTE — Progress Notes (Signed)
[id number] 

## 2012-07-31 NOTE — Progress Notes (Signed)
[name], YAZDANI NO.:  [id number]  MEDICAL RECORD NO.:  [id number]  LOCATION:  [facility]                         FACILITY:  [facility]  PHYSICIAN:  Melvyn Novas, MDDATE OF BIRTH:  1916/01/25  DATE OF PROCEDURE: DATE OF DISCHARGE:                                PROGRESS NOTE   SUBJECTIVE:  The patient is a 79 year old white female, who ambulates with a four-point walker and states she has not fallen in 6 months. Lives at home and was admitted yesterday with a diagnosis of pulmonary emboli based on CT angio revealing small pulmonary emboli of the right lung.  Venous studies of lower extremities today are essentially unrevealing for DVT.  Echo is pending and ordered.  She is currently placed on Lovenox.  OBJECTIVE:  VITAL SIGNS:  Blood pressure 155/64, temperature 97.3, pulse 72 and regular, respiratory rate is 18. LUNGS:  Clear.  No rales, wheeze, or rhonchi. HEART:  Regular rhythm.  No murmurs, gallops, or rubs. ABDOMEN:  Soft, nontender.  Bowel sounds normoactive.  LABORATORY DATA:  Hemoglobin 16.4, creatinine 1.44.  ASSESSMENT AND PLAN:  She presented with dyspnea and mild pleuritic chest pain.  Trying to assess her risk and benefit ratio of taking Coumadin with four-point walker and no falls in 6 months.  We will therefore obtain pharmacy consult for administration of Coumadin unless she declare if she is to have risk.  Legs show no clinical evidence of DVT.     Melvyn Novas, MD     RMD/MEDQ  D:  07/30/2012  T:  07/31/2012  Job:  [id number]

## 2012-07-31 NOTE — Progress Notes (Signed)
ANTICOAGULATION CONSULT NOTE   Pharmacy Consult for Lovenox --> Warfarin Indication: pulmonary embolus   No Known Allergies  Patient Measurements: Height: 5\' 6"  (167.6 cm) Weight: 190 lb 14.7 oz (86.6 kg) IBW/kg (Calculated) : 59.3  Vital Signs: Temp: 97.3 F (36.3 C) (07/06 0545) Temp src: Oral (07/06 0545) BP: 149/79 mmHg (07/06 0545) Pulse Rate: 76 (07/06 0545)  Labs:  Recent Labs  07/29/12 1421 07/30/12 0537 07/30/12 0810 07/31/12 0625  HGB 16.3* 16.4*  --  15.6*  HCT 48.5* 48.4*  --  47.0*  PLT 259 272  --  264  LABPROT  --   --  13.3 13.2  INR  --   --  1.03 1.02  CREATININE 1.42* 1.44*  --   --   TROPONINI <0.30  --   --   --     Estimated Creatinine Clearance: 25.9 ml/min (by C-G formula based on Cr of 1.44).   Medical History: Past Medical History  Diagnosis Date  . Hyperlipidemia, mild   . GERD (gastroesophageal reflux disease)   . Essential hypertension   . Chest pain     Medications:  Scheduled:  . ALPRAZolam  0.5 mg Oral BID  . amLODipine  10 mg Oral Daily  . aspirin  81 mg Oral Daily  . bumetanide  1 mg Oral Daily  . calcium-vitamin D  1 tablet Oral BID  . enoxaparin (LOVENOX) injection  1 mg/kg Subcutaneous Q24H  . ferrous sulfate  325 mg Oral Q breakfast  . pantoprazole  40 mg Oral Daily  . sodium chloride  3 mL Intravenous Q12H  . sodium chloride  3 mL Intravenous Q12H  . Warfarin - Pharmacist Dosing Inpatient   Does not apply Q24H    Assessment: Okay for Protocol Estimated Creatinine Clearance: 25.9 ml/min (by C-G formula based on Cr of 1.44). Patient eating 50-75% of meals per chart documentation CBC Stable, No change in INR  Goal of Therapy:  Anti-Xa level 0.6-1.2 units/ml 4hrs after LMWH dose given if checked. Monitor platelets by anticoagulation protocol: Yes   Plan:  Lovenox 1mg /kg SQ every 24 hours (adjusted for renal function). CBC at least every 3 days while on Lovenox. Repeat Warfarin 5mg  PO x 1 today. Education  completed with patient.  Have asked nurse to contact pharmacy if/when family arrives for further education of family if they desire.  NOTE:  Current renal function would not allow for treatment with newer anticoagulant options (i.e. Xarelto or Pradaxa).  Mady Gemma 07/31/2012,10:09 AM

## 2012-07-31 NOTE — Progress Notes (Signed)
Discussed with the patients daughter in law that I had given the patient the coumadin booklet and the she had watched the video, but I still think that he patient is going to need some help at home with helping her to eat the right foods, lab work etc that goes along with coumadin therapy.  Her daughter in law/ son lives [address] and would along with her members of the family help her.  Her daughter in law is an [employer] fundraiser and states that she has been talking with the patients daughter as well.  I will continue to talk with the patient about anticoagulant therapy.

## 2012-08-01 DIAGNOSIS — I359 Nonrheumatic aortic valve disorder, unspecified: Secondary | ICD-10-CM

## 2012-08-01 LAB — GLUCOSE, CAPILLARY
Glucose-Capillary: 128 mg/dL — ABNORMAL HIGH (ref 70–99)
Glucose-Capillary: 103 mg/dL — ABNORMAL HIGH (ref 70–99)
Glucose-Capillary: 103 mg/dL — ABNORMAL HIGH (ref 70–99)

## 2012-08-01 LAB — CBC
Platelets: 237 10*3/uL (ref 150–400)
RBC: 4.93 MIL/uL (ref 3.87–5.11)
RDW: 12.6 % (ref 11.5–15.5)
WBC: 8.2 10*3/uL (ref 4.0–10.5)

## 2012-08-01 LAB — PROTIME-INR
INR: 1.2 (ref 0.00–1.49)
Prothrombin Time: 14.9 seconds (ref 11.6–15.2)

## 2012-08-01 MED ORDER — WARFARIN SODIUM 5 MG PO TABS
5.0000 mg | ORAL_TABLET | Freq: Once | ORAL | Status: AC
  Administered 2012-08-01: 5 mg via ORAL
  Filled 2012-08-01: qty 1

## 2012-08-01 NOTE — Progress Notes (Signed)
NAMESOBIA, KARGER              ACCOUNT NO.:  [id number]  MEDICAL RECORD NO.:  [id number]  LOCATION:  [facility]                         FACILITY:  [facility]  PHYSICIAN:  Shara Hartis G. Renard Matter, MD   DATE OF BIRTH:  Jun 16, 1915  DATE OF PROCEDURE: DATE OF DISCHARGE:                                PROGRESS NOTE   SUBJECTIVE:  This patient is fairly comfortable this morning.  She was admitted with diagnosis of pulmonary emboli.  She had a CT angio which revealed small pulmonary emboli of the right lung.  She does not have evidence of DVT.  A 2D echo has been ordered.  She has been started on Coumadin dose by pharmacy.  OBJECTIVE:  VITAL SIGNS:  Blood pressure 143/60, respirations 20, pulse 69, temp 97.3. HEENT:  Eyes, PERRLA.  TMs, negative.  Oropharynx, benign. NECK:  Supple.  No JVD or thyroid abnormalities. LUNGS:  Clear to P and A. HEART:  Regular rhythm.  No murmurs. ABDOMEN:  No palpable organs or masses.  PLAN:  To proceed with Coumadin dose by pharmacy.  We will attempt to ambulate.     Kaedynce Tapp G. Renard Matter, MD     AGM/MEDQ  D:  08/01/2012  T:  08/01/2012  Job:  [id number]

## 2012-08-01 NOTE — Progress Notes (Signed)
*  PRELIMINARY RESULTS* Echocardiogram 2D Echocardiogram has been performed.  [name] 08/01/2012, 11:44 AM

## 2012-08-01 NOTE — Progress Notes (Signed)
[name], [name] NO.:  [id number]  MEDICAL RECORD NO.:  [id number]  LOCATION:  [facility]                         FACILITY:  [facility]  PHYSICIAN:  Melvyn Novas, MDDATE OF BIRTH:  1915/03/31  DATE OF PROCEDURE: DATE OF DISCHARGE:                                PROGRESS NOTE   The patient is a 79 year old white female, who ambulates with a four- point walker and has not fallen in 6 months.  She states she came into the hospital with a right-sided pulmonary emboli with severe pleuritic pain and which is now subsided.  Doppler sonograms revealed no evidence of DVT in lower extremities.  I have elected to start her on Coumadin, this is day 2 as she is stable on her feet with a 4 point walker and alert and __________ consideration given to no anticoagulation versus Xarelto use or clinical alternatives.  PHYSICAL EXAMINATION:  VITAL SIGNS:  Blood pressure 149/79, pulse 76 and regular, respiratory rate is 20, temperature 97.3. LUNGS:  Clear.  No rales, wheeze, or rhonchi with some diminished breath sounds in the bases.  HEART:  Regular rhythm.  No murmurs, gallops, or rubs. ABDOMEN:  Soft, nontender.  Bowel sounds normoactive.  Hemoglobin 15.6, INR 1.02.  PLAN:  Right now is to continue Coumadin as well as Lovenox.  Monitor INR daily, ambulate patient out of bed 3 times a day to chair and we will have Physical Therapy consultation.     Melvyn Novas, MD     RMD/MEDQ  D:  07/31/2012  T:  07/31/2012  Job:  [id number]

## 2012-08-01 NOTE — Evaluation (Signed)
Physical Therapy Evaluation Patient Details Name: [name] MRN: [id number] DOB: 06-Feb-1915 Today's Date: 08/01/2012 Time: 4098-1191 PT Time Calculation (min): 37 min  PT Assessment / Plan / Recommendation History of Present Illness   Pt lives alone and has support of a son.  She is independent at home with a cane, mobility limited by overall deconditioning.  She is not O2 dependent at home.  She has a Clinical cytogeneticist on Wheels as support.  She has sustained a PE and is now on supplemental O2.  Clinical Impression   Pt is seen for evaluation.  She is very alert and oriented, cooperative.  She is close to prior functional baseline but is quite deconditioned.  At home, she is unable to get in/out of a bed so she sleeps in a lift/recliner chair.  She also has difficutly bathing.  Her gait with a walker is significantly limited.  Her O2 sats were checked.  On 2 L O2, O2 sat was 97%.  On RA, after gait, her O2 sat = 91% and did drop to 89% for a bit.  O2 was replaced in her nose.  She would benefit from HHPT and aide services.    PT Assessment  Patient needs continued PT services    Follow Up Recommendations  Home health PT    Does the patient have the potential to tolerate intense rehabilitation    No  Barriers to Discharge Decreased caregiver support      Equipment Recommendations  None recommended by PT    Recommendations for Other Services     Frequency Min 3X/week    Precautions / Restrictions Precautions Precautions: Fall Restrictions Weight Bearing Restrictions: No   Pertinent Vitals/Pain       Mobility  Bed Mobility Bed Mobility: Supine to Sit Supine to Sit: HOB elevated;3: Mod assist Transfers Transfers: Sit to Stand;Stand to Sit;Stand Pivot Transfers Sit to Stand: With upper extremity assist;4: Min assist Stand to Sit: 4: Min guard;With upper extremity assist Stand Pivot Transfers: 3: Mod assist Ambulation/Gait Ambulation/Gait Assistance:  5: Supervision Ambulation Distance (Feet): 20 Feet Assistive device: Rolling walker Gait Pattern: Within Functional Limits;Trunk flexed Gait velocity: very slow gait General Gait Details: after walking 12', pt c/o mild dizziness...O2 sat =91% on RA Stairs: No Wheelchair Mobility Wheelchair Mobility: No    Exercises     PT Diagnosis: Difficulty walking;Generalized weakness  PT Problem List: Decreased strength;Decreased activity tolerance;Decreased mobility;Cardiopulmonary status limiting activity;Obesity PT Treatment Interventions: Gait training;Therapeutic exercise;Patient/family education     PT Goals(Current goals can be found in the care plan section) Acute Rehab PT Goals Patient Stated Goal: to be able to remain at home PT Goal Formulation: With patient Time For Goal Achievement: 08/15/12 Potential to Achieve Goals: Good  Visit Information  Last PT Received On: 08/01/12       Prior Functioning  Home Living Family/patient expects to be discharged to:: Private residence Living Arrangements: Alone Available Help at Discharge: Family;Available PRN/intermittently Type of Home: Apartment Home Access: Level entry Home Layout: One level Home Equipment: Walker - 4 wheels Prior Function Level of Independence: Independent with assistive device(s) Comments: ambulates with a walker, sleeps in a lift chair/recliner...son does all of her grocery shopping, pays bills...pt states that she has difficulty bathing Communication Communication: No difficulties    Cognition  Cognition Arousal/Alertness: Awake/alert Behavior During Therapy: WFL for tasks assessed/performed Overall Cognitive Status: Within Functional Limits for tasks assessed    Extremity/Trunk Assessment Upper Extremity Assessment Upper Extremity  Assessment: Generalized weakness Lower Extremity Assessment Lower Extremity Assessment: Generalized weakness Cervical / Trunk Assessment Cervical / Trunk Assessment:  Kyphotic   Balance Balance Balance Assessed: No (WNL by functional observation)  End of Session PT - End of Session Equipment Utilized During Treatment: Gait belt Activity Tolerance: Patient limited by fatigue Patient left: in chair;with call bell/phone within reach;with chair alarm set  GP     Myrlene Broker L 08/01/2012, 10:20 AM

## 2012-08-01 NOTE — Progress Notes (Signed)
ANTICOAGULATION CONSULT NOTE   Pharmacy Consult for Lovenox --> Warfarin Indication: pulmonary embolus   No Known Allergies  Patient Measurements: Height: 5\' 6"  (167.6 cm) Weight: 190 lb 14.7 oz (86.6 kg) IBW/kg (Calculated) : 59.3  Vital Signs: Temp: 97.3 F (36.3 C) (07/07 0506) Temp src: Oral (07/07 0506) BP: 143/60 mmHg (07/07 0506) Pulse Rate: 69 (07/07 0506)  Labs:  Recent Labs  07/29/12 1421 07/30/12 0537 07/30/12 0810 07/31/12 0625 08/01/12 0540  HGB 16.3* 16.4*  --  15.6* 15.3*  HCT 48.5* 48.4*  --  47.0* 45.7  PLT 259 272  --  264 237  LABPROT  --   --  13.3 13.2 14.9  INR  --   --  1.03 1.02 1.20  CREATININE 1.42* 1.44*  --   --   --   TROPONINI <0.30  --   --   --   --     Estimated Creatinine Clearance: 25.9 ml/min (by C-G formula based on Cr of 1.44).   Medical History: Past Medical History  Diagnosis Date  . Hyperlipidemia, mild   . GERD (gastroesophageal reflux disease)   . Essential hypertension   . Chest pain     Medications:  Scheduled:  . ALPRAZolam  0.5 mg Oral BID  . amLODipine  10 mg Oral Daily  . aspirin  81 mg Oral Daily  . bumetanide  1 mg Oral Daily  . calcium-vitamin D  1 tablet Oral BID  . enoxaparin (LOVENOX) injection  1 mg/kg Subcutaneous Q24H  . ferrous sulfate  325 mg Oral Q breakfast  . pantoprazole  40 mg Oral Daily  . sodium chloride  3 mL Intravenous Q12H  . sodium chloride  3 mL Intravenous Q12H  . Warfarin - Pharmacist Dosing Inpatient   Does not apply Q24H    Assessment: Okay for Protocol Estimated Creatinine Clearance: 25.9 ml/min (by C-G formula based on Cr of 1.44). Patient eating 50-75% of meals per chart documentation CBC Stable, INR trending up.  Goal of Therapy:  Anti-Xa level 0.6-1.2 units/ml 4hrs after LMWH dose given if checked. Monitor platelets by anticoagulation protocol: Yes   Plan:  Lovenox 1mg /kg SQ every 24 hours (adjusted for renal function). CBC at least every 3 days while on  Lovenox. Repeat Warfarin 5mg  PO x 1 today. Education completed with patient.  Have asked nurse to contact pharmacy if/when family arrives for further education of family if they desire.  NOTE:  Current renal function would not allow for treatment with newer anticoagulant options (i.e. Xarelto or Pradaxa).  Mady Gemma 08/01/2012,9:02 AM

## 2012-08-02 LAB — PROTIME-INR
INR: 1.6 — ABNORMAL HIGH (ref 0.00–1.49)
Prothrombin Time: 18.6 seconds — ABNORMAL HIGH (ref 11.6–15.2)

## 2012-08-02 LAB — BASIC METABOLIC PANEL
Calcium: 8.9 mg/dL (ref 8.4–10.5)
GFR calc Af Amer: 35 mL/min — ABNORMAL LOW (ref >90)
GFR calc non Af Amer: 30 mL/min — ABNORMAL LOW (ref >90)
Glucose, Bld: 91 mg/dL (ref 70–99)
Sodium: 139 mEq/L (ref 135–145)

## 2012-08-02 MED ORDER — WARFARIN SODIUM 2.5 MG PO TABS
2.5000 mg | ORAL_TABLET | Freq: Once | ORAL | Status: AC
  Administered 2012-08-02: 2.5 mg via ORAL
  Filled 2012-08-02: qty 1

## 2012-08-02 NOTE — Progress Notes (Signed)
[name], [name]              ACCOUNT NO.:  [id number]  MEDICAL RECORD NO.:  [id number]  LOCATION:  [facility]                         FACILITY:  [facility]  PHYSICIAN:  Wofford Stratton G. Renard Matter, MD   DATE OF BIRTH:  1915-05-16  DATE OF PROCEDURE: DATE OF DISCHARGE:                                PROGRESS NOTE   HISTORY OF PRESENT ILLNESS:  This patient remains fairly comfortable this morning.  She had a little discomfort around her nose from the nasal cannula.  She was admitted with diagnosis of pulmonary emboli.  A CT angio revealed small pulmonary emboli in the right lung.  She does not have evidence of DVT.  2D echo has been ordered.  She has been started on Coumadin, dosed by pharmacy, and is not at therapeutic level yet.  PHYSICAL EXAMINATION:  VITAL SIGNS:  Blood pressure 143/55, respirations 20, pulse 68, and temperature 97.3. HEENT:  Eyes, PERRLA.  TM negative.  Oropharynx benign. NECK:  Supple.  No JVD or thyroid abnormalities. LUNGS:  Clear to P and A. HEART:  Regular rhythm.  No murmurs. ABDOMEN:  No palpable organs or masses.  ASSESSMENT:  [name].  PLAN:  To proceed with anticoagulation with Coumadin by pharmacy, will attempt to ambulate.     Marleni Gallardo G. Renard Matter, MD     AGM/MEDQ  D:  08/02/2012  T:  08/02/2012  Job:  [id number]

## 2012-08-02 NOTE — Progress Notes (Signed)
ANTICOAGULATION CONSULT NOTE   Pharmacy Consult for Lovenox --> Warfarin Indication: pulmonary embolus   No Known Allergies  Patient Measurements: Height: 5\' 6"  (167.6 cm) Weight: 190 lb 14.7 oz (86.6 kg) IBW/kg (Calculated) : 59.3  Vital Signs: Temp: 97.3 F (36.3 C) (07/08 0430) Temp src: Oral (07/08 0430) BP: 143/55 mmHg (07/08 0430) Pulse Rate: 68 (07/08 0430)  Labs:  Recent Labs  07/31/12 0625 08/01/12 0540 08/02/12 0545  HGB 15.6* 15.3*  --   HCT 47.0* 45.7  --   PLT 264 237  --   LABPROT 13.2 14.9 18.6*  INR 1.02 1.20 1.60*  CREATININE  --   --  1.42*   Estimated Creatinine Clearance: 26.3 ml/min (by C-G formula based on Cr of 1.42).  Medical History: Past Medical History  Diagnosis Date  . Hyperlipidemia, mild   . GERD (gastroesophageal reflux disease)   . Essential hypertension   . Chest pain    Medications:  Scheduled:  . ALPRAZolam  0.5 mg Oral BID  . amLODipine  10 mg Oral Daily  . aspirin  81 mg Oral Daily  . bumetanide  1 mg Oral Daily  . calcium-vitamin D  1 tablet Oral BID  . enoxaparin (LOVENOX) injection  1 mg/kg Subcutaneous Q24H  . ferrous sulfate  325 mg Oral Q breakfast  . pantoprazole  40 mg Oral Daily  . sodium chloride  3 mL Intravenous Q12H  . sodium chloride  3 mL Intravenous Q12H  . warfarin  2.5 mg Oral Once  . Warfarin - Pharmacist Dosing Inpatient   Does not apply Q24H   Assessment: Okay for Protocol,  Day #4 of Lovenox/Warfarin over-lap Estimated Creatinine Clearance: 26.3 ml/min (by C-G formula based on Cr of 1.42). Patient eating 50-75% of meals per chart documentation CBC Stable, INR trending up.  Goal of Therapy:  Anti-Xa level 0.6-1.2 units/ml 4hrs after LMWH dose given if checked. Monitor platelets by anticoagulation protocol: Yes   Plan:  Lovenox 1mg /kg SQ every 24 hours (adjusted for renal function). CBC at least every 3 days while on Lovenox. Warfarin 2.5mg  PO x 1 today. Overlap Lovenox & Warfarin until  INR > 2 x 24 hrs Education completed with patient.  Have asked nurse to contact pharmacy if/when family arrives for further education of family if they desire.  NOTE:  Current renal function would not allow for treatment with newer anticoagulant options (i.e. Xarelto or Pradaxa).  Valrie Hart A 08/02/2012,8:41 AM

## 2012-08-02 NOTE — Progress Notes (Signed)
Physical Therapy Treatment Patient Details Name: [name] MRN: [id number] DOB: Mar 18, 1915 Today's Date: 08/02/2012 Time: 7829-5621 PT Time Calculation (min): 31 min  PT Assessment / Plan / Recommendation  PT Comments   Pt reports feeling better today.  She is found to have  96% O2 sat on 2 L O2 while seated.  Off of O2, her O2 sat=92% with exercise and gait.  Discussed with her RN and we left O2 off of pt.  Gait endurance was significantly improved and she walked 66' with good stability.  She should be close to functional baseline.  Follow Up Recommendations        Does the patient have the potential to tolerate intense rehabilitation   No  Barriers to Discharge        Equipment Recommendations       Recommendations for Other Services    Frequency     Progress towards PT Goals Progress towards PT goals: Progressing toward goals  Plan Current plan remains appropriate    Precautions / Restrictions Restrictions Weight Bearing Restrictions: No   Pertinent Vitals/Pain     Mobility  Transfers Sit to Stand: From chair/3-in-1;4: Min guard;With upper extremity assist Stand to Sit: To chair/3-in-1;4: Min guard;With upper extremity assist Ambulation/Gait Ambulation/Gait Assistance: 5: Supervision Ambulation Distance (Feet): 80 Feet Assistive device: Rolling walker Gait Pattern: Within Functional Limits;Trunk flexed Gait velocity: WNL General Gait Details: mild dizziness after walking 60', O2 sat=92% on RA Wheelchair Mobility Wheelchair Mobility: No    Exercises General Exercises - Upper Extremity Shoulder Flexion: AROM;Both;5 reps;Seated General Exercises - Lower Extremity Ankle Circles/Pumps: AROM;Both;10 reps;Seated Long Arc Quad: AROM;Both;10 reps;Seated Hip Flexion/Marching: AROM;Both;10 reps;Seated Other Exercises Other Exercises: seated forward flexion for back stretch for 5 repetions Other Exercises: cervical rotation , 5 repetitions   PT Diagnosis:    PT  Problem List:   PT Treatment Interventions:     PT Goals (current goals can now be found in the care plan section)    Visit Information  Last PT Received On: 08/02/12    Subjective Data      Cognition       Balance     End of Session PT - End of Session Equipment Utilized During Treatment: Gait belt Activity Tolerance: Patient tolerated treatment well Patient left: in chair;with call bell/phone within reach;with chair alarm set Nurse Communication: Mobility status   GP     Konrad Penta 08/02/2012, 11:41 AM

## 2012-08-03 LAB — CBC
HCT: 45.3 % (ref 36.0–46.0)
MCHC: 33.6 g/dL (ref 30.0–36.0)
Platelets: 261 10*3/uL (ref 150–400)
RDW: 12.7 % (ref 11.5–15.5)

## 2012-08-03 LAB — PROTIME-INR: INR: 2 — ABNORMAL HIGH (ref 0.00–1.49)

## 2012-08-03 MED ORDER — WARFARIN SODIUM 2.5 MG PO TABS
2.5000 mg | ORAL_TABLET | Freq: Once | ORAL | Status: AC
  Administered 2012-08-03: 2.5 mg via ORAL
  Filled 2012-08-03: qty 1

## 2012-08-03 MED ORDER — FLEET ENEMA 7-19 GM/118ML RE ENEM: 1.0000 | ENEMA | Freq: Once | RECTAL | Status: DC

## 2012-08-03 MED ORDER — ENOXAPARIN SODIUM 100 MG/ML ~~LOC~~ SOLN
90.0000 mg | SUBCUTANEOUS | Status: AC
  Administered 2012-08-03: 90 mg via SUBCUTANEOUS
  Filled 2012-08-03: qty 1

## 2012-08-03 MED ORDER — WARFARIN SODIUM 2.5 MG PO TABS: 2.5000 mg | ORAL_TABLET | Freq: Once | ORAL | Status: DC

## 2012-08-03 MED ORDER — PANTOPRAZOLE SODIUM 40 MG PO TBEC: 40.0000 mg | DELAYED_RELEASE_TABLET | Freq: Every day | ORAL | Status: DC

## 2012-08-03 NOTE — Care Management Note (Signed)
    Page 1 of 1   08/03/2012     11:54:48 AM   CARE MANAGEMENT NOTE 08/03/2012  Patient:  [name], [name]   Account Number:  [id number]  Date Initiated:  08/03/2012  Documentation initiated by:  Rosemary Holms  Subjective/Objective Assessment:   Pt admitted from home where she lives alone. Sons are active in her care. Would like HH RN PT and aide through [facility]     Action/Plan:   Anticipated DC Date:  08/03/2012   Anticipated DC Plan:  HOME W HOME HEALTH SERVICES      DC Planning Services  CM consult      Choice offered to / List presented to:          [facility] arranged  HH-1 RN  HH-10 DISEASE MANAGEMENT  HH-2 PT  HH-4 NURSE'S AIDE      HH agency  [facility]   Status of service:  Completed, signed off Medicare Important Message given?  YES (If response is "NO", the following Medicare IM given date fields will be blank) Date Medicare IM given:  08/03/2012 Date Additional Medicare IM given:    Discharge Disposition:  HOME W HOME HEALTH SERVICES  Per UR Regulation:    If discussed at Long Length of Stay Meetings, dates discussed:    Comments:  08/03/12 1150 Briya Lookabaugh RN BSN CM

## 2012-08-03 NOTE — Progress Notes (Signed)
ANTICOAGULATION CONSULT NOTE   Pharmacy Consult for Warfarin Indication: pulmonary embolus   No Known Allergies  Patient Measurements: Height: 5\' 6"  (167.6 cm) Weight: 190 lb 14.7 oz (86.6 kg) IBW/kg (Calculated) : 59.3  Vital Signs: Temp: 97.7 F (36.5 C) (07/09 0530) Temp src: Oral (07/09 0530) BP: 144/79 mmHg (07/09 0923) Pulse Rate: 73 (07/09 0530)  Labs:  Recent Labs  08/01/12 0540 08/02/12 0545 08/03/12 0539  HGB 15.3*  --  15.2*  HCT 45.7  --  45.3  PLT 237  --  261  LABPROT 14.9 18.6* 22.1*  INR 1.20 1.60* 2.00*  CREATININE  --  1.42*  --    Estimated Creatinine Clearance: 26.3 ml/min (by C-G formula based on Cr of 1.42).  Medical History: Past Medical History  Diagnosis Date  . Hyperlipidemia, mild   . GERD (gastroesophageal reflux disease)   . Essential hypertension   . Chest pain    Medications:  Scheduled:  . ALPRAZolam  0.5 mg Oral BID  . amLODipine  10 mg Oral Daily  . aspirin  81 mg Oral Daily  . bumetanide  1 mg Oral Daily  . calcium-vitamin D  1 tablet Oral BID  . ferrous sulfate  325 mg Oral Q breakfast  . pantoprazole  40 mg Oral Daily  . sodium chloride  3 mL Intravenous Q12H  . sodium chloride  3 mL Intravenous Q12H  . sodium phosphate  1 enema Rectal Once  . warfarin  2.5 mg Oral Once  . Warfarin - Pharmacist Dosing Inpatient   Does not apply Q24H   Assessment: Okay for Protocol,  Day #5 of Lovenox/Warfarin over-lap Estimated Creatinine Clearance: 26.3 ml/min (by C-G formula based on Cr of 1.42). INR is still rising and is therapeutic today. H/H is stable.  Goal of Therapy:  Anti-Xa level 0.6-1.2 units/ml 4hrs after LMWH dose given if checked. Monitor platelets by anticoagulation protocol: Yes   Plan:  Lovenox 1mg /kg SQ every 24 hours (last dose is today) CBC at least every 3 days while on Lovenox. Warfarin 2.5mg  PO x 1 today. INR daily until stable Overlap Lovenox & Warfarin x 5 days total (as INR therapeutic) Education  completed with patient.  Have asked nurse to contact pharmacy if/when family arrives for further education of family if they desire.  Margo Aye, Quetzally Callas A 08/03/2012,10:26 AM

## 2012-08-03 NOTE — Progress Notes (Signed)
Pt ambulated by PT today in hallway using front wheel walker.  Pt ambulated on room air.  O2 sats while ambulating 99%.  Pt had no complaints of pain or shortness of breath.  Dr. Renard Matter notified.

## 2012-08-03 NOTE — Discharge Summary (Cosign Needed)
[name], [name]              ACCOUNT NO.:  [id number]  MEDICAL RECORD NO.:  [id number]  LOCATION:  [facility]                         FACILITY:  [facility]  PHYSICIAN:  Leahann Lempke G. Renard Matter, MD   DATE OF BIRTH:  1915/11/11  DATE OF ADMISSION:  07/29/2012 DATE OF DISCHARGE:  LH                              DISCHARGE SUMMARY   ADDENDUM:  This patient will be sent home on the following medications; 1. Protonix 40 mg daily. 2. Warfarin 2.5 mg daily. 3. Alprazolam 0.5 mg twice daily as directed. 4. Norvasc 10 mg daily. 5. Colchicine 0.6 mg 1 daily as needed for gout. 6. Multivitamin 1 daily. 7. Os-Cal D 500/200 mg 1 tablet b.i.d. 8. Vitamin C 500 mg daily. 9. Zyrtec 10 mg daily. 10.Potassium chloride 20 mEq b.i.d. 11.Bumex 1 mg daily. 12.Ferrous sulfate 325 mg daily.  The patient was stable at the time of her discharge.     Winola Drum G. Renard Matter, MD     AGM/MEDQ  D:  08/03/2012  T:  08/03/2012  Job:  [id number]

## 2012-08-03 NOTE — Progress Notes (Signed)
Physical Therapy Treatment Patient Details Name: [name] MRN: [id number] DOB: [birth year] Today's Date: 08/03/2012 Time: 4098-1191 PT Time Calculation (min): 21 min  PT Assessment / Plan / Recommendation  PT Comments   Pt with improved bed mobility and increased ease with ambulation.  Only assistance needed with supine to sitting at EOB was cueing for hand placement and min assistance to scoot to EOB.  Following discussion with RN, gait training complete with no nasal cannual.  Pt initially at O2 sat 98% without O2, 95% after walking 60' and O2 sat at 88% upon return to room and sitting.  Cueing for deep breathing returned to 93%O2 sat in 5 seconds.  Pt left in chair with alarm set and call bell within reach.  Follow Up Recommendations        Does the patient have the potential to tolerate intense rehabilitation     Barriers to Discharge        Equipment Recommendations       Recommendations for Other Services    Frequency     Progress towards PT Goals Progress towards PT goals: Progressing toward goals  Plan      Precautions / Restrictions Precautions Precautions: Fall Restrictions Weight Bearing Restrictions: No   Pertinent Vitals/Pain 0/10    Mobility  Bed Mobility Bed Mobility: Supine to Sit Supine to Sit: 4: Min assist;HOB elevated Details for Bed Mobility Assistance: Min assistance with scooting to EOB Transfers Transfers: Sit to Stand;Stand to Sit Sit to Stand: 4: Min guard;With upper extremity assist;From bed;From toilet Stand to Sit: 4: Min guard;With upper extremity assist;To chair/3-in-1;To toilet Ambulation/Gait Ambulation/Gait Assistance: 5: Supervision Ambulation Distance (Feet): 80 Feet Assistive device: Rolling walker Gait Pattern: Within Functional Limits;Trunk flexed Gait velocity: WNL General Gait Details: mild dizziness initially upon standing, O2 sat 95% after walking for 60' Stairs: No    Exercises General Exercises - Lower  Extremity Ankle Circles/Pumps: AROM;Both;10 reps;Seated   PT Diagnosis:    PT Problem List:   PT Treatment Interventions:     PT Goals (current goals can now be found in the care plan section)    Visit Information  Last PT Received On: 08/03/12    Subjective Data      Cognition  Cognition Arousal/Alertness: Awake/alert Behavior During Therapy: [facility] for tasks assessed/performed Overall Cognitive Status: Within Functional Limits for tasks assessed    Balance     End of Session PT - End of Session Equipment Utilized During Treatment: Gait belt Activity Tolerance: Patient tolerated treatment well Patient left: in chair;with call bell/phone within reach;with chair alarm set Nurse Communication: Mobility status   GP     Juel Burrow 08/03/2012, 10:26 AM

## 2012-08-03 NOTE — Progress Notes (Signed)
AVS reviewed with patient.  Pt and pt's son educated on Warfarin, bleeding risk w/ signs and symptoms and lab worok follow-up.  Provided with educational materials.  Verbalized understanding of d/c instructions.  Teach back method used.  Pt has no IV access.  Pt and pt's son verbalized understanding of follow-up appt with Dr. Renard Matter this Friday (since Dr. Sudie Bailey is out of the country).  Educated on what medications need to be taken for the rest of the day.  Pt transported by NT via w/c to main entrance for discharge.  Pt stable at time of discharge.

## 2012-08-03 NOTE — Discharge Summary (Cosign Needed)
NAMESHALAYNA, ORNSTEIN              ACCOUNT NO.:  [id number]  MEDICAL RECORD NO.:  [id number]  LOCATION:  [facility]                         FACILITY:  [facility]  PHYSICIAN:  Keandra Medero G. Renard Matter, MD   DATE OF BIRTH:  1916/01/14  DATE OF ADMISSION:  07/29/2012 DATE OF DISCHARGE:  LH                              DISCHARGE SUMMARY   ADDENDUM  This patient has additional diagnosis of chronic kidney disease stage III.  Renal function appears to be near baseline compare to lab values in February 2014.  No change in medications.     Janelle Culton G. Renard Matter, MD     AGM/MEDQ  D:  08/03/2012  T:  08/03/2012  Job:  [id number]

## 2012-08-04 NOTE — Discharge Summary (Cosign Needed)
[name], [name]              ACCOUNT NO.:  [id number]  MEDICAL RECORD NO.:  [id number]  LOCATION:  [facility]                         FACILITY:  [facility]  PHYSICIAN:  Basem Yannuzzi G. Renard Matter, MD   DATE OF BIRTH:  1915-04-05  DATE OF ADMISSION:  07/29/2012 DATE OF DISCHARGE:  LH                              DISCHARGE SUMMARY   This 79 year old female was admitted on July 29, 2012 and discharged August 03, 2012, 5 days hospitalization.  DIAGNOSES: 1. Pulmonary emboli, right lung. 2. Gastroesophageal reflux disease. 3. Hiatal hernia. 4. Essential hypertension.  CONDITION:  Stable and improved at the time of her discharge.  HISTORY OF PRESENT ILLNESS:  This patient came to the emergency room with shortness of breath.  This had been occurring for approximately 1 week.  She does feel some chest pain in substernal area.  She had been on oxygen approximately 1 year ago.  She has not had any cough, nausea or vomiting.  CT of the chest on admission revealed right-sided pulmonary emboli.  Subsequent venous Doppler ultrasound did not show evidence of clots in her legs.  She was therefore admitted for further evaluation and care.  PHYSICAL EXAMINATION:  GENERAL:  Alert. VITAL SIGNS:  The patient with blood pressure 152/97, pulse 66, respirations 18, temperature 98. HEENT:  Eyes, PERRLA.  TM negative.  Oropharynx benign. NECK:  Supple.  No JVD or thyroid abnormalities. HEART:  Regular rhythm.  No murmurs. LUNGS:  Rhonchi bilaterally. ABDOMEN:  No palpable organs or masses. SKIN:  Warm and dry. NEUROLOGICAL:  Grossly intact.  LABORATORY DATA:  Admission CBC; WBC 6900 with hemoglobin 16.3, hematocrit 48.5.  Chemistries; sodium 138, potassium 4.4, chloride 99, CO2 32, and glucose 103, BUN 25, creatinine 1.42, calcium 9.9, AST 27, ALT 13, alkaline phosphatase 59, bilirubin 0.7, protein is 6.8, albumin 3.4.  ProBNP 445.4.  Troponin less than 0.3.  Chemistries on August 01, 2012; glucose 103, sodium 139,  potassium 3.5, chloride is 97, CO2 36, BUN 32, creatinine 1.42, calcium 8.9, GFR 30, __________ 91, alkaline phosphatase 59, albumin 3.4, AST 27, and ALT 13 and D-dimer is 0.75. Prothrombin time on July 8 was 18.6 with INR at 1.60.  RADIOLOGY:  Bilateral lower extremity venous Doppler ultrasound; no evidence of deep vein thrombosis in the lower extremities.  CT angio of the chest, small pulmonary emboli in the right lung, no other acute findings.  Portable chest x-ray reveals large hiatal hernia and low lung volumes.  HOSPITAL COURSE:  The patient at the time of her admission was placed at bed rest.  She was placed on nasal O2 and started on sodium chloride drip 0.9%.  She was continued on the following medications; Xanax 0.5 mg b.i.d., Norvasc 10 mg daily, chewable aspirin 81 mg daily, Bumex tabs 1 mg daily, Os-Cal and vitamin D 500/200 one daily, Lovenox injections 85 mg every 24 hours, ferrous sulfate 325 mg daily, Protonix 40 mg daily, warfarin was started by pharmacy and dosed on a daily basis.  Her prothrombin time was slow to get up to therapeutic level.  The patient has remained relatively comfortable throughout the hospital stay.     Harlie Buening G. Renard Matter, MD  AGM/MEDQ  D:  08/03/2012  T:  08/03/2012  Job:  [id number]

## 2012-09-14 ENCOUNTER — Emergency Department (HOSPITAL_COMMUNITY): Payer: Medicare Other

## 2012-09-14 ENCOUNTER — Encounter (HOSPITAL_COMMUNITY): Payer: Self-pay

## 2012-09-14 ENCOUNTER — Inpatient Hospital Stay (HOSPITAL_COMMUNITY)
Admission: EM | Admit: 2012-09-14 | Discharge: 2012-09-21 | DRG: 469 | Disposition: A | Discharge status: Skilled Nursing Facility | Payer: Medicare Other | Attending: Internal Medicine | Admitting: Internal Medicine

## 2012-09-14 DIAGNOSIS — W010XXA Fall on same level from slipping, tripping and stumbling without subsequent striking against object, initial encounter: Secondary | ICD-10-CM | POA: Diagnosis present

## 2012-09-14 DIAGNOSIS — E785 Hyperlipidemia, unspecified: Secondary | ICD-10-CM

## 2012-09-14 DIAGNOSIS — K449 Diaphragmatic hernia without obstruction or gangrene: Secondary | ICD-10-CM

## 2012-09-14 DIAGNOSIS — N390 Urinary tract infection, site not specified: Secondary | ICD-10-CM

## 2012-09-14 DIAGNOSIS — K921 Melena: Secondary | ICD-10-CM

## 2012-09-14 DIAGNOSIS — Z79899 Other long term (current) drug therapy: Secondary | ICD-10-CM

## 2012-09-14 DIAGNOSIS — Z860100 Personal history of colon polyps, unspecified: Secondary | ICD-10-CM

## 2012-09-14 DIAGNOSIS — I10 Essential (primary) hypertension: Secondary | ICD-10-CM

## 2012-09-14 DIAGNOSIS — S72033A Displaced midcervical fracture of unspecified femur, initial encounter for closed fracture: Principal | ICD-10-CM | POA: Diagnosis present

## 2012-09-14 DIAGNOSIS — I4891 Unspecified atrial fibrillation: Secondary | ICD-10-CM | POA: Diagnosis present

## 2012-09-14 DIAGNOSIS — W19XXXA Unspecified fall, initial encounter: Secondary | ICD-10-CM

## 2012-09-14 DIAGNOSIS — S72001A Fracture of unspecified part of neck of right femur, initial encounter for closed fracture: Secondary | ICD-10-CM

## 2012-09-14 DIAGNOSIS — S0083XA Contusion of other part of head, initial encounter: Secondary | ICD-10-CM

## 2012-09-14 DIAGNOSIS — K222 Esophageal obstruction: Secondary | ICD-10-CM

## 2012-09-14 DIAGNOSIS — D72829 Elevated white blood cell count, unspecified: Secondary | ICD-10-CM

## 2012-09-14 DIAGNOSIS — M810 Age-related osteoporosis without current pathological fracture: Secondary | ICD-10-CM | POA: Diagnosis present

## 2012-09-14 DIAGNOSIS — R0602 Shortness of breath: Secondary | ICD-10-CM

## 2012-09-14 DIAGNOSIS — B952 Enterococcus as the cause of diseases classified elsewhere: Secondary | ICD-10-CM | POA: Diagnosis present

## 2012-09-14 DIAGNOSIS — K219 Gastro-esophageal reflux disease without esophagitis: Secondary | ICD-10-CM

## 2012-09-14 DIAGNOSIS — Z7901 Long term (current) use of anticoagulants: Secondary | ICD-10-CM

## 2012-09-14 DIAGNOSIS — E860 Dehydration: Secondary | ICD-10-CM | POA: Diagnosis present

## 2012-09-14 DIAGNOSIS — S72009A Fracture of unspecified part of neck of unspecified femur, initial encounter for closed fracture: Secondary | ICD-10-CM

## 2012-09-14 DIAGNOSIS — Y92009 Unspecified place in unspecified non-institutional (private) residence as the place of occurrence of the external cause: Secondary | ICD-10-CM

## 2012-09-14 DIAGNOSIS — R5381 Other malaise: Secondary | ICD-10-CM | POA: Diagnosis present

## 2012-09-14 DIAGNOSIS — J9600 Acute respiratory failure, unspecified whether with hypoxia or hypercapnia: Secondary | ICD-10-CM

## 2012-09-14 DIAGNOSIS — I129 Hypertensive chronic kidney disease with stage 1 through stage 4 chronic kidney disease, or unspecified chronic kidney disease: Secondary | ICD-10-CM | POA: Diagnosis present

## 2012-09-14 DIAGNOSIS — N183 Chronic kidney disease, stage 3 unspecified: Secondary | ICD-10-CM

## 2012-09-14 DIAGNOSIS — Z86711 Personal history of pulmonary embolism: Secondary | ICD-10-CM

## 2012-09-14 DIAGNOSIS — I509 Heart failure, unspecified: Secondary | ICD-10-CM | POA: Diagnosis present

## 2012-09-14 DIAGNOSIS — A4902 Methicillin resistant Staphylococcus aureus infection, unspecified site: Secondary | ICD-10-CM | POA: Diagnosis present

## 2012-09-14 DIAGNOSIS — R079 Chest pain, unspecified: Secondary | ICD-10-CM

## 2012-09-14 DIAGNOSIS — Z66 Do not resuscitate: Secondary | ICD-10-CM | POA: Diagnosis present

## 2012-09-14 DIAGNOSIS — I482 Chronic atrial fibrillation, unspecified: Secondary | ICD-10-CM

## 2012-09-14 DIAGNOSIS — D649 Anemia, unspecified: Secondary | ICD-10-CM

## 2012-09-14 DIAGNOSIS — I2510 Atherosclerotic heart disease of native coronary artery without angina pectoris: Secondary | ICD-10-CM

## 2012-09-14 DIAGNOSIS — I2699 Other pulmonary embolism without acute cor pulmonale: Secondary | ICD-10-CM

## 2012-09-14 HISTORY — DX: Pneumonia, unspecified organism: J18.9

## 2012-09-14 LAB — URINALYSIS, ROUTINE W REFLEX MICROSCOPIC
Bilirubin Urine: NEGATIVE
Glucose, UA: NEGATIVE mg/dL
Hgb urine dipstick: NEGATIVE
Urobilinogen, UA: 0.2 mg/dL (ref 0.0–1.0)

## 2012-09-14 LAB — BASIC METABOLIC PANEL
BUN: 23 mg/dL (ref 6–23)
CO2: 34 mEq/L — ABNORMAL HIGH (ref 19–32)
Chloride: 96 mEq/L (ref 96–112)
Creatinine, Ser: 1.63 mg/dL — ABNORMAL HIGH (ref 0.50–1.10)
Glucose, Bld: 132 mg/dL — ABNORMAL HIGH (ref 70–99)

## 2012-09-14 LAB — CBC
HCT: 49.8 % — ABNORMAL HIGH (ref 36.0–46.0)
Hemoglobin: 16.3 g/dL — ABNORMAL HIGH (ref 12.0–15.0)
MCH: 30.4 pg (ref 26.0–34.0)
MCV: 92.7 fL (ref 78.0–100.0)
RBC: 5.37 MIL/uL — ABNORMAL HIGH (ref 3.87–5.11)

## 2012-09-14 LAB — URINE MICROSCOPIC-ADD ON

## 2012-09-14 LAB — PROTIME-INR: Prothrombin Time: 34.1 seconds — ABNORMAL HIGH (ref 11.6–15.2)

## 2012-09-14 MED ORDER — HYDROCODONE-ACETAMINOPHEN 5-325 MG PO TABS
1.0000 | ORAL_TABLET | Freq: Four times a day (QID) | ORAL | Status: DC | PRN
  Administered 2012-09-15: 1 via ORAL
  Administered 2012-09-15: 2 via ORAL
  Administered 2012-09-16 (×2): 1 via ORAL
  Filled 2012-09-14: qty 2
  Filled 2012-09-14 (×3): qty 1

## 2012-09-14 MED ORDER — LORATADINE 10 MG PO TABS
10.0000 mg | ORAL_TABLET | Freq: Every day | ORAL | Status: DC
  Administered 2012-09-15 – 2012-09-21 (×7): 10 mg via ORAL
  Filled 2012-09-14 (×7): qty 1

## 2012-09-14 MED ORDER — MORPHINE SULFATE 2 MG/ML IJ SOLN: 0.5000 mg | INTRAMUSCULAR | Status: DC | PRN

## 2012-09-14 MED ORDER — CALCIUM CARBONATE-VITAMIN D 500-200 MG-UNIT PO TABS
1.0000 | ORAL_TABLET | Freq: Two times a day (BID) | ORAL | Status: DC
  Administered 2012-09-15 – 2012-09-21 (×14): 1 via ORAL
  Filled 2012-09-14 (×20): qty 1

## 2012-09-14 MED ORDER — AMLODIPINE BESYLATE 10 MG PO TABS
10.0000 mg | ORAL_TABLET | Freq: Every day | ORAL | Status: DC
  Administered 2012-09-15 – 2012-09-21 (×7): 10 mg via ORAL
  Filled 2012-09-14 (×7): qty 1

## 2012-09-14 MED ORDER — FENTANYL CITRATE 0.05 MG/ML IJ SOLN
INTRAMUSCULAR | Status: AC
  Filled 2012-09-14: qty 2

## 2012-09-14 MED ORDER — HEPARIN SODIUM (PORCINE) 5000 UNIT/ML IJ SOLN
5000.0000 [IU] | Freq: Three times a day (TID) | INTRAMUSCULAR | Status: DC
  Filled 2012-09-14: qty 1

## 2012-09-14 MED ORDER — SODIUM CHLORIDE 0.9 % IV SOLN
INTRAVENOUS | Status: DC
  Administered 2012-09-14: via INTRAVENOUS

## 2012-09-14 MED ORDER — FENTANYL CITRATE 0.05 MG/ML IJ SOLN
25.0000 ug | Freq: Once | INTRAMUSCULAR | Status: AC
  Administered 2012-09-14: 25 ug via INTRAVENOUS
  Filled 2012-09-14: qty 2

## 2012-09-14 MED ORDER — PANTOPRAZOLE SODIUM 40 MG PO TBEC
40.0000 mg | DELAYED_RELEASE_TABLET | Freq: Every day | ORAL | Status: DC
  Administered 2012-09-15 – 2012-09-21 (×8): 40 mg via ORAL
  Filled 2012-09-14 (×8): qty 1

## 2012-09-14 MED ORDER — BUMETANIDE 1 MG PO TABS
1.0000 mg | ORAL_TABLET | ORAL | Status: DC
  Administered 2012-09-15 – 2012-09-17 (×2): 1 mg via ORAL
  Filled 2012-09-14 (×3): qty 1

## 2012-09-14 MED ORDER — COLCHICINE 0.6 MG PO TABS
0.6000 mg | ORAL_TABLET | Freq: Every day | ORAL | Status: DC | PRN
  Filled 2012-09-14: qty 1

## 2012-09-14 MED ORDER — ALPRAZOLAM 0.5 MG PO TABS: 0.5000 mg | ORAL_TABLET | ORAL | Status: DC

## 2012-09-14 MED ORDER — ALPRAZOLAM 0.5 MG PO TABS
0.2500 mg | ORAL_TABLET | Freq: Two times a day (BID) | ORAL | Status: DC | PRN
  Administered 2012-09-15 – 2012-09-18 (×2): 0.5 mg via ORAL
  Filled 2012-09-14 (×3): qty 1

## 2012-09-14 MED ORDER — ADULT MULTIVITAMIN W/MINERALS CH
1.0000 | ORAL_TABLET | Freq: Every day | ORAL | Status: DC
  Administered 2012-09-15 – 2012-09-21 (×7): 1 via ORAL
  Filled 2012-09-14 (×7): qty 1

## 2012-09-14 MED ORDER — FERROUS SULFATE 325 (65 FE) MG PO TABS
325.0000 mg | ORAL_TABLET | Freq: Two times a day (BID) | ORAL | Status: DC
  Administered 2012-09-15 – 2012-09-21 (×13): 325 mg via ORAL
  Filled 2012-09-14 (×19): qty 1

## 2012-09-14 MED ORDER — VITAMIN C 500 MG PO TABS: 500.0000 mg | ORAL_TABLET | Freq: Every day | ORAL | Status: DC

## 2012-09-14 MED ORDER — FENTANYL CITRATE 0.05 MG/ML IJ SOLN
25.0000 ug | Freq: Once | INTRAMUSCULAR | Status: AC
  Administered 2012-09-14: 25 ug via INTRAVENOUS

## 2012-09-14 NOTE — ED Provider Notes (Signed)
CSN: [id number]     Arrival date & time 09/14/12  1408 History  This chart was scribed for Dagmar Hait, MD by Danella Maiers, ED Scribe. This patient was seen in room [facility] and the patient's care was started at 3:32 PM.    Chief Complaint  Patient presents with  . Fall    Patient is a 79 y.o. female presenting with fall. The history is provided by the patient. No language interpreter was used.  Fall This is a recurrent (one fall 2 years ago) problem. The current episode started 6 to 12 hours ago. The problem occurs rarely. The problem has not changed since onset.Pertinent negatives include no chest pain and no shortness of breath. Nothing aggravates the symptoms. Nothing relieves the symptoms. She has tried nothing for the symptoms. The treatment provided no relief.   HPI Comments: [name] is a 79 y.o. female brought in by ambulance to the Emergency Department complaining of an accidental fall that occurred earlier today, while she was walking in her home. Pt reports landing on her right hip on a hard wood floor. She did not lose consciousness and she denies head injury. She denies experiencing SOB, weakness, dizziness and CP before she fell. Pt experienced numbness in her right foot after the fall, which she states has subsided. She denies having any heart problems or A-fib. Pt has a h/o PE. Pt is on coumadin. She states that she had a similar accidental fall 2 years ago.   PCP- Dr. John Giovanni   Past Medical History  Diagnosis Date  . Hyperlipidemia, mild   . GERD (gastroesophageal reflux disease)   . Essential hypertension   . Chest pain    Past Surgical History  Procedure Laterality Date  . Appendectomy     Family History  Problem Relation Age of Onset  . Brain cancer Mother   . Heart disease Father   . Heart attack Brother    History  Substance Use Topics  . Smoking status: Never Smoker   . Smokeless tobacco: Never Used  . Alcohol Use: No    OB History   Grav Para Term Preterm Abortions TAB SAB Ect Mult Living                 Review of Systems  HENT: Negative for hearing loss.   Respiratory: Negative for shortness of breath.   Cardiovascular: Negative for chest pain.  Skin: Negative for rash.  Neurological: Positive for numbness (right foot, subsided). Negative for dizziness, syncope and weakness.  Psychiatric/Behavioral: Negative for confusion.  All other systems reviewed and are negative.    Allergies  Review of patient's allergies indicates no known allergies.  Home Medications   Current Outpatient Rx  Name  Route  Sig  Dispense  Refill  . ALPRAZolam (XANAX) 0.5 MG tablet   Oral   Take 0.5 mg by mouth 2 (two) times daily.           Marland Kitchen amLODipine (NORVASC) 10 MG tablet   Oral   Take 10 mg by mouth daily.           . bumetanide (BUMEX) 1 MG tablet   Oral   Take 1 mg by mouth daily.         . calcium-vitamin D (OSCAL WITH D) 500-200 MG-UNIT per tablet   Oral   Take 1 tablet by mouth 2 (two) times daily.          . cetirizine (ZYRTEC) 10 MG  tablet   Oral   Take 10 mg by mouth daily. For allergy symptoms         . colchicine 0.6 MG tablet   Oral   Take 0.6 mg by mouth daily as needed. For gout         . ferrous sulfate (IRON SUPPLEMENT) 325 (65 FE) MG tablet   Oral   Take 325 mg by mouth daily with breakfast.         . Multiple Vitamin (MULTIVITAMIN) tablet   Oral   Take 1 tablet by mouth daily.           . pantoprazole (PROTONIX) 40 MG tablet   Oral   Take 1 tablet (40 mg total) by mouth daily.   30 tablet   2   . potassium chloride (K-DUR,KLOR-CON) 10 MEQ tablet   Oral   Take 20 mEq by mouth 2 (two) times daily.         . vitamin C (ASCORBIC ACID) 500 MG tablet   Oral   Take 500 mg by mouth daily.           Marland Kitchen warfarin (COUMADIN) 2.5 MG tablet   Oral   Take 1 tablet (2.5 mg total) by mouth once.   30 tablet   2    Triage Vitals: BP 144/69  Pulse 77   Temp(Src) 97.6 F (36.4 C) (Oral)  Resp 22  Ht 5\' 2"  (1.575 m)  Wt 180 lb (81.647 kg)  BMI 32.91 kg/m2  SpO2 90%  Physical Exam  Nursing note and vitals reviewed. Constitutional: She is oriented to person, place, and time. She appears well-developed and well-nourished.  Non-toxic appearance. She does not appear ill. No distress.  HENT:  Head: Normocephalic and atraumatic.  Nose: No mucosal edema or rhinorrhea.  Mouth/Throat: Mucous membranes are normal. No dental abscesses or edematous.  Eyes: Conjunctivae and EOM are normal. Pupils are equal, round, and reactive to light.  Neck: Full passive range of motion without pain. Neck supple. No tracheal deviation present.  In c-collar  Cardiovascular: Normal rate, regular rhythm and normal heart sounds.   Pulmonary/Chest: Effort normal and breath sounds normal. No respiratory distress. She has no rhonchi. She exhibits no crepitus.  Abdominal: Soft. Normal appearance and bowel sounds are normal.  Musculoskeletal: Normal range of motion.  Right leg externally rotated and short. Mild bruising on the right wrist.  Neurological: She is alert and oriented to person, place, and time. She has normal strength.  Skin: Skin is warm, dry and intact.  Psychiatric: She has a normal mood and affect. Her speech is normal and behavior is normal. Her mood appears not anxious.    ED Course   Medications  fentaNYL (SUBLIMAZE) injection 25 mcg (25 mcg Intravenous Given 09/14/12 1757)   DIAGNOSTIC STUDIES: Oxygen Saturation is 90% on RA, normal by my interpretation.    COORDINATION OF CARE: 3:43 PM-Discussed treatment plan which includes CXR, CBC, BMP panel with pt at bedside and pt agreed to plan.    Procedures (including critical care time)  Labs Reviewed  CBC - Abnormal; Notable for the following:    WBC 14.7 (*)    RBC 5.37 (*)    Hemoglobin 16.3 (*)    HCT 49.8 (*)    All other components within normal limits  BASIC METABOLIC PANEL -  Abnormal; Notable for the following:    CO2 34 (*)    Glucose, Bld 132 (*)    Creatinine, Ser 1.63 (*)  GFR calc non Af Amer 26 (*)    GFR calc Af Amer 30 (*)    All other components within normal limits  PROTIME-INR - Abnormal; Notable for the following:    Prothrombin Time 34.1 (*)    INR 3.54 (*)    All other components within normal limits  TROPONIN I   Dg Wrist Complete Right  09/14/2012   *RADIOLOGY REPORT*  Clinical Data: Pain and bruising post fall  RIGHT WRIST - COMPLETE 3+ VIEW  Comparison: 02/19/2005  Findings: Osseous demineralization. Degenerative changes at first [facility] joint and at STT joint. Minimal narrowing of radiocarpal joint. No acute fracture, dislocation, or bone destruction. Soft tissue calcification at the distal forearm is unchanged. No definite acute bony abnormalities seen.  IMPRESSION: Osseous demineralization with degenerative changes in wrist as above. No acute abnormalities.   Original Report Authenticated By: Ulyses Southward, M.D.   Dg Hip Complete Right  09/14/2012   *RADIOLOGY REPORT*  Clinical Data:  Fall  RIGHT HIP - COMPLETE 2+ VIEW  Comparison: None  Findings: Leg is rotated on AP view. Diffuse osseous demineralization. Questionable angular deformity at the junction of the head/neck. Unable to exclude right femoral neck fracture. No dislocation seen. Visualized pelvis appears intact.  IMPRESSION: Rotated exam. Question angular deformity at the junction of the right femoral head/neck, unable to exclude fracture; recommend CT right hip without contrast to evaluate.   Original Report Authenticated By: Ulyses Southward, M.D.   Dg Femur Right  09/14/2012   *RADIOLOGY REPORT*  Clinical Data:  Right femur and hip pain post fall  RIGHT FEMUR - 2 VIEW  Comparison: None.  Findings: Rotated on AP view. Questionable angular deformity at the junction of the right femoral head/neck, unable to exclude fracture. Remainder of the right femur appears intact. Bones diffusely  demineralized. No additional fracture or dislocation.  IMPRESSION: Unable to exclude fracture at the junction of the right femoral head/neck; recommend CT imaging of the right hip without contrast or further evaluation.   Original Report Authenticated By: Ulyses Southward, M.D.   Ct Head Wo Contrast  09/14/2012   *RADIOLOGY REPORT*  Clinical Data: Fall, headache  CT HEAD WITHOUT CONTRAST,CT CERVICAL SPINE WITHOUT CONTRAST  Technique:  Contiguous axial images were obtained from the base of the skull through the vertex without contrast.,Technique: Multidetector CT imaging of the cervical spine was performed. Multiplanar CT image reconstructions were also generated.  Comparison: None.  Findings: No skull fracture is noted.  There is a right peri orbital and right lateral extra orbital soft tissue swelling. Bilateral eye globe is symmetrical in appearance.  No intraorbital hematoma.  There is mucosal thickening posterior aspect of the left sphenoid sinus.  Atherosclerotic calcifications of carotid siphon are noted.  No intracranial hemorrhage, mass effect or midline shift.  Moderate cerebral atrophy.  Lacunar infarct is noted in the right basal ganglia.  Periventricular and subcortical white matter decreased attenuation probable due to chronic small vessel ischemic changes. No acute cortical infarction.  No mass lesion is noted on this unenhanced scan. Moderate cerebral atrophy.  Mild periventricular white matter decreased attenuation probable due to chronic small vessel ischemic changes.  IMPRESSION:  1.  There is right periorbital and right lateral frontal soft tissue swelling.  No intraorbital hematoma. 2.  No skull fracture is noted. 3. Moderate cerebral atrophy.  Mild periventricular white matter decreased attenuation probable due to chronic small vessel ischemic changes.  No definite acute cortical infarction.  CT cervical spine without IV contrast:  Axial images of the cervical spine shows no acute fracture or  subluxation.  There is no pneumothorax in visualized lung apices.  Computer processed images shows no acute fracture or subluxation. Atherosclerotic calcifications of thoracic aorta and carotid arteries.  Diffuse osteopenia.  Degenerative changes thoracic articulation. Mild disc space flattening with mild posterior spurring at C5-C6 level.  Mild disc space flattening with mild anterior and moderate posterior spurring at C6-C7 level.  No prevertebral soft tissue swelling.  Cervical airway is patent.  Mild spinal canal stenosis due to posterior spurring at C6-C7 level.  Impression: 1.  No acute fracture or subluxation.  Diffuse osteopenia. Degenerative changes as described above.   Original Report Authenticated By: Natasha Mead, M.D.   Ct Cervical Spine Wo Contrast  09/14/2012   *RADIOLOGY REPORT*  Clinical Data: Fall, headache  CT HEAD WITHOUT CONTRAST,CT CERVICAL SPINE WITHOUT CONTRAST  Technique:  Contiguous axial images were obtained from the base of the skull through the vertex without contrast.,Technique: Multidetector CT imaging of the cervical spine was performed. Multiplanar CT image reconstructions were also generated.  Comparison: None.  Findings: No skull fracture is noted.  There is a right peri orbital and right lateral extra orbital soft tissue swelling. Bilateral eye globe is symmetrical in appearance.  No intraorbital hematoma.  There is mucosal thickening posterior aspect of the left sphenoid sinus.  Atherosclerotic calcifications of carotid siphon are noted.  No intracranial hemorrhage, mass effect or midline shift.  Moderate cerebral atrophy.  Lacunar infarct is noted in the right basal ganglia.  Periventricular and subcortical white matter decreased attenuation probable due to chronic small vessel ischemic changes. No acute cortical infarction.  No mass lesion is noted on this unenhanced scan. Moderate cerebral atrophy.  Mild periventricular white matter decreased attenuation probable due to  chronic small vessel ischemic changes.  IMPRESSION:  1.  There is right periorbital and right lateral frontal soft tissue swelling.  No intraorbital hematoma. 2.  No skull fracture is noted. 3. Moderate cerebral atrophy.  Mild periventricular white matter decreased attenuation probable due to chronic small vessel ischemic changes.  No definite acute cortical infarction.  CT cervical spine without IV contrast:  Axial images of the cervical spine shows no acute fracture or subluxation.  There is no pneumothorax in visualized lung apices.  Computer processed images shows no acute fracture or subluxation. Atherosclerotic calcifications of thoracic aorta and carotid arteries.  Diffuse osteopenia.  Degenerative changes thoracic articulation. Mild disc space flattening with mild posterior spurring at C5-C6 level.  Mild disc space flattening with mild anterior and moderate posterior spurring at C6-C7 level.  No prevertebral soft tissue swelling.  Cervical airway is patent.  Mild spinal canal stenosis due to posterior spurring at C6-C7 level.  Impression: 1.  No acute fracture or subluxation.  Diffuse osteopenia. Degenerative changes as described above.   Original Report Authenticated By: Natasha Mead, M.D.   Dg Pelvis Portable  09/14/2012   *RADIOLOGY REPORT*  Clinical Data: Fall.  Right hip pain  PORTABLE PELVIS  Comparison: 03/28/2011  Findings: No definite fracture.  If the patient has  significant right hip pain and cannot ambulate, further dedicated imaging of the right hip is suggested.  There is mild degenerative change in the right hip and moderate degenerative change and spurring in the left hip.  IMPRESSION: No definite displaced fracture is identified.  Further detailed imaging of the right hip may be helpful if the patient has persistent pain.   Original Report Authenticated By: Janeece Riggers,  M.D.   Dg Chest Port 1 View  09/14/2012   *RADIOLOGY REPORT*  Clinical Data: Fall  PORTABLE CHEST - 1 VIEW  Comparison:  07/29/2012  Findings: Cardiac enlargement.  Large hiatal hernia overlying the heart is unchanged.  There is left lower lobe atelectasis.  Negative for heart failure or pneumonia.  IMPRESSION: Large left hiatal hernia with left lower lobe atelectasis.  No acute abnormality.   Original Report Authenticated By: Janeece Riggers, M.D.   1. Fall, initial encounter   2. Traumatic hematoma of forehead, initial encounter   3. CKD (chronic kidney disease) stage 3, GFR 30-59 ml/min   4. Pulmonary emboli   5. Unspecified essential hypertension   6. Hip fracture, right, closed, initial encounter   7. Closed right hip fracture, initial encounter     Date: 09/14/2012  Rate: 72  Rhythm: atrial fibrillation  QRS Axis: left  Intervals: normal  ST/T Wave abnormalities: normal  Conduction Disutrbances:none  Narrative Interpretation:   Old EKG Reviewed: unchanged    MDM  79 year old female here at mechanical fall. Fell at home on a concrete floor. No preceding symptoms. Complaining of right hip pain. Vitals are stable here. EKG is normal with A. fib noted. She is a right frontal hematoma. She has no signs of entrapment on her exam. She has no C-spine tenderness. She has some mild lower back tenderness. She has mild bruising of her right wrist. She is a right leg that is externally rotated and shortened. Labs show elevated INR. Normal troponin. X-rays are negative for Obvious acute fracture. CT of her hip shows a right femoral neck fracture. Hospital is submitted patient. Patient is requesting certain orthopedic surgeon in [place]. The orthopedic surgeons practice was consult and will see patient in the hospital. Hospital is excepting to [facility] cone the patient transferred.   I personally performed the services described in this documentation, which was scribed in my presence. The recorded information has been reviewed and is accurate.  I have reviewed all labs and imaging and considered them in my medical  decision making.     Dagmar Hait, MD 09/14/12 2212

## 2012-09-14 NOTE — ED Notes (Signed)
Still waiting for Orthopedic call back. Transfer on hold until contact is made

## 2012-09-14 NOTE — ED Notes (Signed)
[facility] cleared and was told ok to remove c collar by Dr. Gwendolyn Grant

## 2012-09-14 NOTE — ED Notes (Addendum)
Pt reports was walking with walker and fell in her home.  Pt has swelling and bruise to r eye.  Deneis any LOC.  C/O pain to r shoulder with palpation and c/o r hip and leg pain.  Pt's r foot slightly discolored.  Pedal pulse heard with doppler.  Pt had dressing to r great toe.  Reprots had toe nail removed approx 2 weeks ago.  Pt says r foot feels numb since the fall.  Foot to resting to the right, r leg appears slightly shorter than left.   Pt can wiggle toes but hurts to move leg.

## 2012-09-14 NOTE — ED Notes (Signed)
Beeped on call ortho at [facility] through [facility].

## 2012-09-14 NOTE — ED Notes (Signed)
Pt states she was getting something out of her cabinet, turned and fell. States she had no LOC. Complain of pain in right shoulder and hip. Has hematoma to right brow. Cbg 118 per EMS

## 2012-09-14 NOTE — H&P (Signed)
[facility] History and Physical  [name]:[id number] DOB: 05-15-1915 DOA: 09/14/2012  Referring physician: ER. PCP: Milana Obey, MD    Chief Complaint: Right hip pain.  HPI: [name] is a 79 y.o. female who had a fall today approximately 1:30 PM. The fall was accidental in nature. The patient denies any dizziness, palpitations, chest pain, dyspnea, loss of consciousness associated with this fall. The family, who are present in the room, denied any other abnormalities noticed about the patient, especially no evidence of confusion. The patient has a history of atrial fibrillation and also suffered a pulmonary to the right lung in the beginning of July of this year. She is also hypertensive. She has no other history of coronary artery disease or cerebrovascular disease. She is no lung disease other than the pulmonary embolism. She is on chronic anticoagulation with Coumadin. She now complains of right hip pain, as would be expected. She has been found to have a right hip fracture on CT scanning.   Review of Systems:  Apart from history of present illness, other systems negative.  Past Medical History  Diagnosis Date  . Hyperlipidemia, mild   . GERD (gastroesophageal reflux disease)   . Essential hypertension   . Chest pain    Past Surgical History  Procedure Laterality Date  . Appendectomy     Social History:  Patient lives by herself and is apparently fairly independent. She walks with a stroller. She does not smoke. She does not drink alcohol.   No Known Allergies  Family History  Problem Relation Age of Onset  . Brain cancer Mother   . Heart disease Father   . Heart attack Brother     Prior to Admission medications   Medication Sig Start Date End Date Taking? Authorizing Provider  ALPRAZolam Prudy Feeler) 0.5 MG tablet Take 0.5 mg by mouth See admin instructions. TAKE ONE TABLET BY MOUTH UP TO TWICE DAILY FOR ANXIETY. Patient only takes one-half  tablet twice daily as needed for anxiety   Yes Historical Provider, MD  amLODipine (NORVASC) 10 MG tablet Take 10 mg by mouth daily.     Yes Historical Provider, MD  bumetanide (BUMEX) 1 MG tablet Take 1 mg by mouth every other day.    Yes Historical Provider, MD  calcium-vitamin D (OSCAL WITH D) 500-200 MG-UNIT per tablet Take 1 tablet by mouth 2 (two) times daily.    Yes Historical Provider, MD  cetirizine (ZYRTEC) 10 MG tablet Take 10 mg by mouth daily. For allergy symptoms   Yes Historical Provider, MD  colchicine 0.6 MG tablet Take 0.6 mg by mouth daily as needed. For gout   Yes Historical Provider, MD  ferrous sulfate (IRON SUPPLEMENT) 325 (65 FE) MG tablet Take 325 mg by mouth 2 (two) times daily.    Yes Historical Provider, MD  pantoprazole (PROTONIX) 40 MG tablet Take 1 tablet (40 mg total) by mouth daily. 08/03/12  Yes Angus Edilia Bo, MD  potassium chloride (K-DUR,KLOR-CON) 10 MEQ tablet Take 10 mEq by mouth 2 (two) times daily.    Yes Historical Provider, MD  vitamin C (ASCORBIC ACID) 500 MG tablet Take 500 mg by mouth daily.     Yes Historical Provider, MD  warfarin (COUMADIN) 2.5 MG tablet Take 2.5-5 mg by mouth daily. Takes one tablet for 2 days then takes two tablets for 1 day, then repeat   Yes Historical Provider, MD  levofloxacin (LEVAQUIN) 500 MG tablet Take 500 mg by mouth daily. 08/30/12  Historical Provider, MD  Multiple Vitamin (MULTIVITAMIN) tablet Take 1 tablet by mouth daily.      Historical Provider, MD   Physical Exam: Filed Vitals:   09/14/12 1815  BP: 138/77  Pulse: 78  Temp: 97.8 F (36.6 C)  Resp: 18     General:  She looks systemically well. She is alert and orientated. She has a bruise over her right eye.  Eyes: Bruise over the right eye but vision is normal. No pallor. No jaundice.  ENT: No abnormalities.  Neck: No lymphadenopathy.  Cardiovascular: Heart sounds are in atrial fibrillation with a ventricular rate of 90-100. There is no evidence of heart  failure. No murmurs.  Respiratory: Lung fields are clear.  Abdomen: Soft, nontender. No hepatosplenomegaly.  Skin: No rash.  Musculoskeletal: Right leg is shortened and externally rotated, consistent with right hip fracture.  Psychiatric: Appropriate affect.  Neurologic: Alert and orientated without any focal neurological signs.  Labs on Admission:  Basic Metabolic Panel:  Recent Labs Lab 09/14/12 1609  NA 138  K 4.0  CL 96  CO2 34*  GLUCOSE 132*  BUN 23  CREATININE 1.63*  CALCIUM 9.3       CBC:  Recent Labs Lab 09/14/12 1609  WBC 14.7*  HGB 16.3*  HCT 49.8*  MCV 92.7  PLT 248   Cardiac Enzymes:  Recent Labs Lab 09/14/12 1609  TROPONINI <0.30    BNP (last 3 results)  Recent Labs  03/19/12 1538 07/29/12 1421  PROBNP 445.4 427.6   CBG: No results found for this basename: GLUCAP,  in the last 168 hours  Radiological Exams on Admission: Dg Wrist Complete Right  09/14/2012   *RADIOLOGY REPORT*  Clinical Data: Pain and bruising post fall  RIGHT WRIST - COMPLETE 3+ VIEW  Comparison: 02/19/2005  Findings: Osseous demineralization. Degenerative changes at first [facility] joint and at STT joint. Minimal narrowing of radiocarpal joint. No acute fracture, dislocation, or bone destruction. Soft tissue calcification at the distal forearm is unchanged. No definite acute bony abnormalities seen.  IMPRESSION: Osseous demineralization with degenerative changes in wrist as above. No acute abnormalities.   Original Report Authenticated By: Ulyses Southward, M.D.   Dg Hip Complete Right  09/14/2012   *RADIOLOGY REPORT*  Clinical Data:  Fall  RIGHT HIP - COMPLETE 2+ VIEW  Comparison: None  Findings: Leg is rotated on AP view. Diffuse osseous demineralization. Questionable angular deformity at the junction of the head/neck. Unable to exclude right femoral neck fracture. No dislocation seen. Visualized pelvis appears intact.  IMPRESSION: Rotated exam. Question angular deformity at the  junction of the right femoral head/neck, unable to exclude fracture; recommend CT right hip without contrast to evaluate.   Original Report Authenticated By: Ulyses Southward, M.D.   Dg Femur Right  09/14/2012   *RADIOLOGY REPORT*  Clinical Data:  Right femur and hip pain post fall  RIGHT FEMUR - 2 VIEW  Comparison: None.  Findings: Rotated on AP view. Questionable angular deformity at the junction of the right femoral head/neck, unable to exclude fracture. Remainder of the right femur appears intact. Bones diffusely demineralized. No additional fracture or dislocation.  IMPRESSION: Unable to exclude fracture at the junction of the right femoral head/neck; recommend CT imaging of the right hip without contrast or further evaluation.   Original Report Authenticated By: Ulyses Southward, M.D.   Ct Head Wo Contrast  09/14/2012   *RADIOLOGY REPORT*  Clinical Data: Fall, headache  CT HEAD WITHOUT CONTRAST,CT CERVICAL SPINE WITHOUT CONTRAST  Technique:  Contiguous axial images were obtained from the base of the skull through the vertex without contrast.,Technique: Multidetector CT imaging of the cervical spine was performed. Multiplanar CT image reconstructions were also generated.  Comparison: None.  Findings: No skull fracture is noted.  There is a right peri orbital and right lateral extra orbital soft tissue swelling. Bilateral eye globe is symmetrical in appearance.  No intraorbital hematoma.  There is mucosal thickening posterior aspect of the left sphenoid sinus.  Atherosclerotic calcifications of carotid siphon are noted.  No intracranial hemorrhage, mass effect or midline shift.  Moderate cerebral atrophy.  Lacunar infarct is noted in the right basal ganglia.  Periventricular and subcortical white matter decreased attenuation probable due to chronic small vessel ischemic changes. No acute cortical infarction.  No mass lesion is noted on this unenhanced scan. Moderate cerebral atrophy.  Mild periventricular white matter  decreased attenuation probable due to chronic small vessel ischemic changes.  IMPRESSION:  1.  There is right periorbital and right lateral frontal soft tissue swelling.  No intraorbital hematoma. 2.  No skull fracture is noted. 3. Moderate cerebral atrophy.  Mild periventricular white matter decreased attenuation probable due to chronic small vessel ischemic changes.  No definite acute cortical infarction.  CT cervical spine without IV contrast:  Axial images of the cervical spine shows no acute fracture or subluxation.  There is no pneumothorax in visualized lung apices.  Computer processed images shows no acute fracture or subluxation. Atherosclerotic calcifications of thoracic aorta and carotid arteries.  Diffuse osteopenia.  Degenerative changes thoracic articulation. Mild disc space flattening with mild posterior spurring at C5-C6 level.  Mild disc space flattening with mild anterior and moderate posterior spurring at C6-C7 level.  No prevertebral soft tissue swelling.  Cervical airway is patent.  Mild spinal canal stenosis due to posterior spurring at C6-C7 level.  Impression: 1.  No acute fracture or subluxation.  Diffuse osteopenia. Degenerative changes as described above.   Original Report Authenticated By: Natasha Mead, M.D.   Ct Cervical Spine Wo Contrast  09/14/2012   *RADIOLOGY REPORT*  Clinical Data: Fall, headache  CT HEAD WITHOUT CONTRAST,CT CERVICAL SPINE WITHOUT CONTRAST  Technique:  Contiguous axial images were obtained from the base of the skull through the vertex without contrast.,Technique: Multidetector CT imaging of the cervical spine was performed. Multiplanar CT image reconstructions were also generated.  Comparison: None.  Findings: No skull fracture is noted.  There is a right peri orbital and right lateral extra orbital soft tissue swelling. Bilateral eye globe is symmetrical in appearance.  No intraorbital hematoma.  There is mucosal thickening posterior aspect of the left sphenoid  sinus.  Atherosclerotic calcifications of carotid siphon are noted.  No intracranial hemorrhage, mass effect or midline shift.  Moderate cerebral atrophy.  Lacunar infarct is noted in the right basal ganglia.  Periventricular and subcortical white matter decreased attenuation probable due to chronic small vessel ischemic changes. No acute cortical infarction.  No mass lesion is noted on this unenhanced scan. Moderate cerebral atrophy.  Mild periventricular white matter decreased attenuation probable due to chronic small vessel ischemic changes.  IMPRESSION:  1.  There is right periorbital and right lateral frontal soft tissue swelling.  No intraorbital hematoma. 2.  No skull fracture is noted. 3. Moderate cerebral atrophy.  Mild periventricular white matter decreased attenuation probable due to chronic small vessel ischemic changes.  No definite acute cortical infarction.  CT cervical spine without IV contrast:  Axial images of the cervical spine shows no  acute fracture or subluxation.  There is no pneumothorax in visualized lung apices.  Computer processed images shows no acute fracture or subluxation. Atherosclerotic calcifications of thoracic aorta and carotid arteries.  Diffuse osteopenia.  Degenerative changes thoracic articulation. Mild disc space flattening with mild posterior spurring at C5-C6 level.  Mild disc space flattening with mild anterior and moderate posterior spurring at C6-C7 level.  No prevertebral soft tissue swelling.  Cervical airway is patent.  Mild spinal canal stenosis due to posterior spurring at C6-C7 level.  Impression: 1.  No acute fracture or subluxation.  Diffuse osteopenia. Degenerative changes as described above.   Original Report Authenticated By: Natasha Mead, M.D.   Dg Pelvis Portable  09/14/2012   *RADIOLOGY REPORT*  Clinical Data: Fall.  Right hip pain  PORTABLE PELVIS  Comparison: 03/28/2011  Findings: No definite fracture.  If the patient has  significant right hip pain and  cannot ambulate, further dedicated imaging of the right hip is suggested.  There is mild degenerative change in the right hip and moderate degenerative change and spurring in the left hip.  IMPRESSION: No definite displaced fracture is identified.  Further detailed imaging of the right hip may be helpful if the patient has persistent pain.   Original Report Authenticated By: Janeece Riggers, M.D.   Ct Hip Right Wo Contrast  09/14/2012   *RADIOLOGY REPORT*  Clinical Data: Right hip pain secondary to a fall today.  CT OF THE RIGHT HIP WITHOUT CONTRAST  Technique:  Multidetector CT imaging was performed according to the standard protocol. Multiplanar CT image reconstructions were also generated.  Comparison: Radiographs dated 09/14/2012  Findings: There is an impacted slightly angulated fracture of the subcapital region of the right femoral neck.  Minimal displacement.  Moderate arthritic changes of the right hip.  No other abnormality.  IMPRESSION: Subcapital fracture of the right femoral neck.   Original Report Authenticated By: Francene Boyers, M.D.   Dg Chest Port 1 View  09/14/2012   *RADIOLOGY REPORT*  Clinical Data: Fall  PORTABLE CHEST - 1 VIEW  Comparison: 07/29/2012  Findings: Cardiac enlargement.  Large hiatal hernia overlying the heart is unchanged.  There is left lower lobe atelectasis.  Negative for heart failure or pneumonia.  IMPRESSION: Large left hiatal hernia with left lower lobe atelectasis.  No acute abnormality.   Original Report Authenticated By: Janeece Riggers, M.D.    EKG: Independently reviewed. Atrial fibrillation. No acute ST-T wave changes.  Assessment/Plan Active Problems:   Hip fracture, right   Unspecified essential hypertension   Pulmonary emboli   CKD (chronic kidney disease) stage 3, GFR 30-59 ml/min   1. Right hip fracture. Fall appears to be accidental. 2. Chronic atrial fibrillation. 3. History of pulmonary embolus approximately 2 months ago, on anticoagulation with  Coumadin. INR is therapeutic. 4. Hypertension. 5. Chronic kidney disease stage III. Appears to be stable, possibly patient is slightly dehydrated.  Plan: 1. Admit to [facility]. This is the patient's request. Telemetry bed. 2. Intravenous fluids. 3. Hold Coumadin. Heparin subcutaneously for DVT prophylaxis. 4. Orthopedics has been consulted. Further recommendations will depend on patient's hospital progress.   Code Status: DO NOT RESUSCITATE. This was confirmed with the patient at the bedside. Family members also were present at the bedside and they agree.   Family Communication: Discussed plan with patient and family members at the bedside.   Disposition Plan: Depending on progress. Will likely need rehabilitation in a skilled nursing facility.   Time spent: 60 minutes.  Wilson Singer [facility] Pager [phone].  If 7PM-7AM, please contact night-coverage [email or web address] Password [facility] 09/14/2012, 8:47 PM

## 2012-09-14 NOTE — ED Notes (Signed)
Pt returned from xray, 02 sat 89-90% on room air.  Pt denies any SOB.  Placed pt on 02 at 2 liters.

## 2012-09-14 NOTE — ED Notes (Signed)
Pt denies any back pain on assessment.  Consulting civil engineer and NT assisted to remove backboard.  Charge RN maintained c spine immobilization during moving pt.  Pt more comfortable off of LSB.

## 2012-09-15 DIAGNOSIS — D72829 Elevated white blood cell count, unspecified: Secondary | ICD-10-CM | POA: Diagnosis present

## 2012-09-15 LAB — TYPE AND SCREEN
ABO/RH(D): A NEG
Antibody Screen: NEGATIVE

## 2012-09-15 LAB — CBC
MCH: 31.2 pg (ref 26.0–34.0)
MCV: 91.5 fL (ref 78.0–100.0)
Platelets: 200 10*3/uL (ref 150–400)
RBC: 4.94 MIL/uL (ref 3.87–5.11)
RDW: 13.6 % (ref 11.5–15.5)

## 2012-09-15 LAB — PROTIME-INR: Prothrombin Time: 20.1 seconds — ABNORMAL HIGH (ref 11.6–15.2)

## 2012-09-15 MED ORDER — VITAMIN K1 10 MG/ML IJ SOLN
5.0000 mg | Freq: Once | INTRAVENOUS | Status: AC
  Administered 2012-09-15: 5 mg via INTRAVENOUS
  Filled 2012-09-15: qty 0.5

## 2012-09-15 MED ORDER — VITAMIN K1 10 MG/ML IJ SOLN
5.0000 mg | Freq: Once | INTRAVENOUS | Status: DC
  Filled 2012-09-15: qty 0.5

## 2012-09-15 NOTE — Progress Notes (Signed)
Pt requesting to talk with son Lonnette Shrode .  Son called and spoke with pt.  Also pt requesting to know when she is go to have surgery in formed her will be tomorrow as instructed by the doctor.  Amanda Pea, Charity fundraiser.

## 2012-09-15 NOTE — Progress Notes (Signed)
Orthopedic Tech Progress Note Patient Details:  [name] [birth year] [id number] Patient unable to use [facility] Patient ID: [name], female   DOB: [birth year], 79 y.o.   MRN: [id number]   Orie Rout 09/15/2012, 1:48 PM

## 2012-09-15 NOTE — Progress Notes (Signed)
[facility] Progress Note  KSENIYA GRUNDEN ZOX:[id number] DOB: Jul 07, 1915 DOA: 09/14/2012  Referring physician: ER. PCP: Milana Obey, MD    Chief Complaint: Right hip pain.  HPI: EARLEAN [name] is a 79 y.o. WF PMHx HTN, HLD, hiatal hernia, PE right lung (July 2014), atrial fibrillation, CK-MB stage III. Presented , to ED 2dary to fall today approximately 1:30 PM Dx Rt hip fracture, . The fall was accidental in nature. The patient denies any dizziness, palpitations, chest pain, dyspnea, loss of consciousness associated with this fall. The family, who are present in the room, denied any other abnormalities noticed about the patient, especially no evidence of confusion.   She has no other history of coronary artery disease or cerebrovascular disease. She is no lung disease other than the pulmonary embolism. She is on chronic anticoagulation with Coumadin. She now complains of right hip pain, as would be expected. She has been found to have a right hip fracture on CT scanning. Pt has been seen by Dr. Guy Sandifer. Nitka([facility]) plans on performing surgery in the a.m.TODAY states ready for surgery. In good spirits.     Procedure CT right hip without contrast 09/14/2012 Subcapital fracture of the right femoral neck   Past Medical History  Diagnosis Date  . Hyperlipidemia, mild   . GERD (gastroesophageal reflux disease)   . Essential hypertension   . Chest pain   . Pneumonia    Past Surgical History  Procedure Laterality Date  . Appendectomy     Social History:  Patient lives by herself and is apparently fairly independent. She walks with a stroller. She does not smoke. She does not drink alcohol.   No Known Allergies  Family History  Problem Relation Age of Onset  . Brain cancer Mother   . Heart disease Father   . Heart attack Brother     Prior to Admission medications   Medication Sig Start Date End Date Taking? Authorizing Provider  ALPRAZolam Prudy Feeler)  0.5 MG tablet Take 0.5 mg by mouth See admin instructions. TAKE ONE TABLET BY MOUTH UP TO TWICE DAILY FOR ANXIETY. Patient only takes one-half tablet twice daily as needed for anxiety   Yes Historical Provider, MD  amLODipine (NORVASC) 10 MG tablet Take 10 mg by mouth daily.     Yes Historical Provider, MD  bumetanide (BUMEX) 1 MG tablet Take 1 mg by mouth every other day.    Yes Historical Provider, MD  calcium-vitamin D (OSCAL WITH D) 500-200 MG-UNIT per tablet Take 1 tablet by mouth 2 (two) times daily.    Yes Historical Provider, MD  cetirizine (ZYRTEC) 10 MG tablet Take 10 mg by mouth daily. For allergy symptoms   Yes Historical Provider, MD  colchicine 0.6 MG tablet Take 0.6 mg by mouth daily as needed. For gout   Yes Historical Provider, MD  ferrous sulfate (IRON SUPPLEMENT) 325 (65 FE) MG tablet Take 325 mg by mouth 2 (two) times daily.    Yes Historical Provider, MD  pantoprazole (PROTONIX) 40 MG tablet Take 1 tablet (40 mg total) by mouth daily. 08/03/12  Yes Angus Edilia Bo, MD  potassium chloride (K-DUR,KLOR-CON) 10 MEQ tablet Take 10 mEq by mouth 2 (two) times daily.    Yes Historical Provider, MD  vitamin C (ASCORBIC ACID) 500 MG tablet Take 500 mg by mouth daily.     Yes Historical Provider, MD  warfarin (COUMADIN) 2.5 MG tablet Take 2.5-5 mg by mouth daily. Takes one tablet for 2 days then  takes two tablets for 1 day, then repeat   Yes Historical Provider, MD  levofloxacin (LEVAQUIN) 500 MG tablet Take 500 mg by mouth daily. 08/30/12   Historical Provider, MD  Multiple Vitamin (MULTIVITAMIN) tablet Take 1 tablet by mouth daily.      Historical Provider, MD   Physical Exam: Filed Vitals:   09/15/12 0555  BP: 144/88  Pulse: 78  Temp: 98.3 F (36.8 C)  Resp: 18     General:  Alert, NAD, bruise over her right eye.  Cardiovascular: A- fibrillation with a ventricular rate of 90-100. There is no evidence of heart failure. No murmurs.  Respiratory: Lung fields are clear.  Abdomen:  Soft, nontender. No hepatosplenomegaly.  Musculoskeletal: Right leg is shortened and externally rotated, consistent with right hip fracture.DP/PT pulse Rt leg 1+; appropriate feeling in toes and feet    Labs on Admission:  Basic Metabolic Panel:  Recent Labs Lab 09/14/12 1609  NA 138  K 4.0  CL 96  CO2 34*  GLUCOSE 132*  BUN 23  CREATININE 1.63*  CALCIUM 9.3       CBC:  Recent Labs Lab 09/14/12 1609  WBC 14.7*  HGB 16.3*  HCT 49.8*  MCV 92.7  PLT 248   Cardiac Enzymes:  Recent Labs Lab 09/14/12 1609  TROPONINI <0.30    BNP (last 3 results)  Recent Labs  03/19/12 1538 07/29/12 1421  PROBNP 445.4 427.6   CBG: No results found for this basename: GLUCAP,  in the last 168 hours  Radiological Exams on Admission: Dg Wrist Complete Right  09/14/2012   *RADIOLOGY REPORT*  Clinical Data: Pain and bruising post fall  RIGHT WRIST - COMPLETE 3+ VIEW  Comparison: 02/19/2005  Findings: Osseous demineralization. Degenerative changes at first [facility] joint and at STT joint. Minimal narrowing of radiocarpal joint. No acute fracture, dislocation, or bone destruction. Soft tissue calcification at the distal forearm is unchanged. No definite acute bony abnormalities seen.  IMPRESSION: Osseous demineralization with degenerative changes in wrist as above. No acute abnormalities.   Original Report Authenticated By: Ulyses Southward, M.D.   Dg Hip Complete Right  09/14/2012   *RADIOLOGY REPORT*  Clinical Data:  Fall  RIGHT HIP - COMPLETE 2+ VIEW  Comparison: None  Findings: Leg is rotated on AP view. Diffuse osseous demineralization. Questionable angular deformity at the junction of the head/neck. Unable to exclude right femoral neck fracture. No dislocation seen. Visualized pelvis appears intact.  IMPRESSION: Rotated exam. Question angular deformity at the junction of the right femoral head/neck, unable to exclude fracture; recommend CT right hip without contrast to evaluate.   Original  Report Authenticated By: Ulyses Southward, M.D.   Dg Femur Right  09/14/2012   *RADIOLOGY REPORT*  Clinical Data:  Right femur and hip pain post fall  RIGHT FEMUR - 2 VIEW  Comparison: None.  Findings: Rotated on AP view. Questionable angular deformity at the junction of the right femoral head/neck, unable to exclude fracture. Remainder of the right femur appears intact. Bones diffusely demineralized. No additional fracture or dislocation.  IMPRESSION: Unable to exclude fracture at the junction of the right femoral head/neck; recommend CT imaging of the right hip without contrast or further evaluation.   Original Report Authenticated By: Ulyses Southward, M.D.   Ct Head Wo Contrast  09/14/2012   *RADIOLOGY REPORT*  Clinical Data: Fall, headache  CT HEAD WITHOUT CONTRAST,CT CERVICAL SPINE WITHOUT CONTRAST  Technique:  Contiguous axial images were obtained from the base of the skull through the  vertex without contrast.,Technique: Multidetector CT imaging of the cervical spine was performed. Multiplanar CT image reconstructions were also generated.  Comparison: None.  Findings: No skull fracture is noted.  There is a right peri orbital and right lateral extra orbital soft tissue swelling. Bilateral eye globe is symmetrical in appearance.  No intraorbital hematoma.  There is mucosal thickening posterior aspect of the left sphenoid sinus.  Atherosclerotic calcifications of carotid siphon are noted.  No intracranial hemorrhage, mass effect or midline shift.  Moderate cerebral atrophy.  Lacunar infarct is noted in the right basal ganglia.  Periventricular and subcortical white matter decreased attenuation probable due to chronic small vessel ischemic changes. No acute cortical infarction.  No mass lesion is noted on this unenhanced scan. Moderate cerebral atrophy.  Mild periventricular white matter decreased attenuation probable due to chronic small vessel ischemic changes.  IMPRESSION:  1.  There is right periorbital and right  lateral frontal soft tissue swelling.  No intraorbital hematoma. 2.  No skull fracture is noted. 3. Moderate cerebral atrophy.  Mild periventricular white matter decreased attenuation probable due to chronic small vessel ischemic changes.  No definite acute cortical infarction.  CT cervical spine without IV contrast:  Axial images of the cervical spine shows no acute fracture or subluxation.  There is no pneumothorax in visualized lung apices.  Computer processed images shows no acute fracture or subluxation. Atherosclerotic calcifications of thoracic aorta and carotid arteries.  Diffuse osteopenia.  Degenerative changes thoracic articulation. Mild disc space flattening with mild posterior spurring at C5-C6 level.  Mild disc space flattening with mild anterior and moderate posterior spurring at C6-C7 level.  No prevertebral soft tissue swelling.  Cervical airway is patent.  Mild spinal canal stenosis due to posterior spurring at C6-C7 level.  Impression: 1.  No acute fracture or subluxation.  Diffuse osteopenia. Degenerative changes as described above.   Original Report Authenticated By: Natasha Mead, M.D.   Ct Cervical Spine Wo Contrast  09/14/2012   *RADIOLOGY REPORT*  Clinical Data: Fall, headache  CT HEAD WITHOUT CONTRAST,CT CERVICAL SPINE WITHOUT CONTRAST  Technique:  Contiguous axial images were obtained from the base of the skull through the vertex without contrast.,Technique: Multidetector CT imaging of the cervical spine was performed. Multiplanar CT image reconstructions were also generated.  Comparison: None.  Findings: No skull fracture is noted.  There is a right peri orbital and right lateral extra orbital soft tissue swelling. Bilateral eye globe is symmetrical in appearance.  No intraorbital hematoma.  There is mucosal thickening posterior aspect of the left sphenoid sinus.  Atherosclerotic calcifications of carotid siphon are noted.  No intracranial hemorrhage, mass effect or midline shift.   Moderate cerebral atrophy.  Lacunar infarct is noted in the right basal ganglia.  Periventricular and subcortical white matter decreased attenuation probable due to chronic small vessel ischemic changes. No acute cortical infarction.  No mass lesion is noted on this unenhanced scan. Moderate cerebral atrophy.  Mild periventricular white matter decreased attenuation probable due to chronic small vessel ischemic changes.  IMPRESSION:  1.  There is right periorbital and right lateral frontal soft tissue swelling.  No intraorbital hematoma. 2.  No skull fracture is noted. 3. Moderate cerebral atrophy.  Mild periventricular white matter decreased attenuation probable due to chronic small vessel ischemic changes.  No definite acute cortical infarction.  CT cervical spine without IV contrast:  Axial images of the cervical spine shows no acute fracture or subluxation.  There is no pneumothorax in visualized lung apices.  Computer processed images shows no acute fracture or subluxation. Atherosclerotic calcifications of thoracic aorta and carotid arteries.  Diffuse osteopenia.  Degenerative changes thoracic articulation. Mild disc space flattening with mild posterior spurring at C5-C6 level.  Mild disc space flattening with mild anterior and moderate posterior spurring at C6-C7 level.  No prevertebral soft tissue swelling.  Cervical airway is patent.  Mild spinal canal stenosis due to posterior spurring at C6-C7 level.  Impression: 1.  No acute fracture or subluxation.  Diffuse osteopenia. Degenerative changes as described above.   Original Report Authenticated By: Natasha Mead, M.D.   Dg Pelvis Portable  09/14/2012   *RADIOLOGY REPORT*  Clinical Data: Fall.  Right hip pain  PORTABLE PELVIS  Comparison: 03/28/2011  Findings: No definite fracture.  If the patient has  significant right hip pain and cannot ambulate, further dedicated imaging of the right hip is suggested.  There is mild degenerative change in the right hip and  moderate degenerative change and spurring in the left hip.  IMPRESSION: No definite displaced fracture is identified.  Further detailed imaging of the right hip may be helpful if the patient has persistent pain.   Original Report Authenticated By: Janeece Riggers, M.D.   Ct Hip Right Wo Contrast  09/14/2012   *RADIOLOGY REPORT*  Clinical Data: Right hip pain secondary to a fall today.  CT OF THE RIGHT HIP WITHOUT CONTRAST  Technique:  Multidetector CT imaging was performed according to the standard protocol. Multiplanar CT image reconstructions were also generated.  Comparison: Radiographs dated 09/14/2012  Findings: There is an impacted slightly angulated fracture of the subcapital region of the right femoral neck.  Minimal displacement.  Moderate arthritic changes of the right hip.  No other abnormality.  IMPRESSION: Subcapital fracture of the right femoral neck.   Original Report Authenticated By: Francene Boyers, M.D.   Dg Chest Port 1 View  09/14/2012   *RADIOLOGY REPORT*  Clinical Data: Fall  PORTABLE CHEST - 1 VIEW  Comparison: 07/29/2012  Findings: Cardiac enlargement.  Large hiatal hernia overlying the heart is unchanged.  There is left lower lobe atelectasis.  Negative for heart failure or pneumonia.  IMPRESSION: Large left hiatal hernia with left lower lobe atelectasis.  No acute abnormality.   Original Report Authenticated By: Janeece Riggers, M.D.    EKG: Independently reviewed. Atrial fibrillation. No acute ST-T wave changes.  Assessment/Plan Active Problems:   Unspecified essential hypertension   Pulmonary emboli   CKD (chronic kidney disease) stage 3, GFR 30-59 ml/min   Hip fracture, right   1. Right hip fracture. Fall appears to be accidental. Spoke with Dr. Guy Sandifer. Nitka([facility]). Will perform surgery on 8/22 in the afternoon.  --Patient to receive vitamin K 5 mg this afternoon  --Patient breakfast and consists ONLY OF clear liquids and to be n.p.o.  afterwards  2. Chronic atrial fibrillation. Currently INR therapeutic 3. History of pulmonary embolus approximately 2 months ago, on anticoagulation with Coumadin. INR is therapeutic. 4. Hypertension. Mildly elevated but given patient's age would not attempt to lower further 5. Chronic kidney disease stage III. mildly elevated (baseline creatinine = 1.44) possibly patient is      slightly dehydrated.  6. Leukocytosis; obtain blood cultures, urine cultures after cultures obtained start antibiotics    Code Status: DO NOT RESUSCITATE. This was confirmed with the patient at the bedside. Family members also were present at the bedside and they agree.   Family Communication: Discussed plan with patient and family members at the bedside.  Disposition Plan: Depending on progress. Will likely need rehabilitation in a skilled nursing facility.   Time spent: 30 minutes.  Drema Dallas [facility] Pager [phone].  If 7PM-7AM, please contact night-coverage [email or web address] Password [facility] 09/15/2012, 7:59 AM

## 2012-09-15 NOTE — Progress Notes (Signed)
Utilization review completed. Rashonda Warrior, RN, BSN. 

## 2012-09-15 NOTE — Progress Notes (Signed)
Patient ID: [name], female   DOB: March 10, 1915, 79 y.o.   MRN: [id number] Aware of patients history of fall with right hip fracture. She requested transfer to [facility] for care and treatment of her Hip fracture. Significant recent history of pulmonary embolism and placed on coumadin recently. INR 3.56. Will eval and perform formal consultation. Xray demonstrates a mildly displaced, right impacted femoral head-high subcapital hip fracture, Household ambulator pre fall, will likely require hemiarthroplasty when coagulation studies normalize.

## 2012-09-15 NOTE — Progress Notes (Signed)
Urine specimen collected and sent to lab.  Amanda Pea, Charity fundraiser.

## 2012-09-15 NOTE — Progress Notes (Signed)
Admitted pt to rm [id number] from [facility] via [facility], pt admitted with a right hip fracture due to a fall. oriented to room, callbell placed within reach, admission assessment done, orders carried out, family members at bedside. Will continue to monitor.

## 2012-09-15 NOTE — Consult Note (Signed)
Reason for Consult:Right hip fracture Referring Physician: [facility] Consulting Physician:Emya Picado E  Orthopedic Diagnosis:Right closed, mildly displaced impacted subcapital hip fracture. Coumadinization for recent pulmonary embolism Senile osteoporosis  ZHY:QMVHQION D Gunby is an 79 y.o. female.Report falling yesterday afternoon, no LOC or vertigo or dizziness. Has had multiple falls in the past year, x2. Seen at [facility] ER via ambulance unable to bear weight on the Right leg with right hip pain. Also with right periorbital ecchymosis and right wrist swelling. CT of brain ? Old lacunar Infarct, CT of cervical spine negative for acute injury. Skull and facial CT with right periorbital soft tissue swelling no Bone injury. Xray of hip and CT of hip shows a mildly displaced right subcapital hip fracture. Patient has recent history Of SOB and diagnosis of pulmonary embolism and is on coumadin INR 3.54. She requested [facility] (Dr. Audrie Lia group) was transferred to [facility] last evening and admitted to medicine.  Past Medical History  Diagnosis Date  . Hyperlipidemia, mild   . GERD (gastroesophageal reflux disease)   . Essential hypertension   . Chest pain   . Pneumonia     Past Surgical History  Procedure Laterality Date  . Appendectomy      Family History  Problem Relation Age of Onset  . Brain cancer Mother   . Heart disease Father   . Heart attack Brother     Social History:  reports that she has never smoked. She has never used smokeless tobacco. She reports that she does not drink alcohol or use illicit drugs.  Allergies: No Known Allergies  Medications:  Prior to Admission:  Prescriptions prior to admission  Medication Sig Dispense Refill  . ALPRAZolam (XANAX) 0.5 MG tablet Take 0.5 mg by mouth See admin instructions. TAKE ONE TABLET BY MOUTH UP TO TWICE DAILY FOR ANXIETY. Patient only takes one-half tablet twice daily as needed for anxiety      .  amLODipine (NORVASC) 10 MG tablet Take 10 mg by mouth daily.        . bumetanide (BUMEX) 1 MG tablet Take 1 mg by mouth every other day.       . calcium-vitamin D (OSCAL WITH D) 500-200 MG-UNIT per tablet Take 1 tablet by mouth 2 (two) times daily.       . cetirizine (ZYRTEC) 10 MG tablet Take 10 mg by mouth daily. For allergy symptoms      . colchicine 0.6 MG tablet Take 0.6 mg by mouth daily as needed. For gout      . ferrous sulfate (IRON SUPPLEMENT) 325 (65 FE) MG tablet Take 325 mg by mouth 2 (two) times daily.       . pantoprazole (PROTONIX) 40 MG tablet Take 1 tablet (40 mg total) by mouth daily.  30 tablet  2  . potassium chloride (K-DUR,KLOR-CON) 10 MEQ tablet Take 10 mEq by mouth 2 (two) times daily.       . vitamin C (ASCORBIC ACID) 500 MG tablet Take 500 mg by mouth daily.        Marland Kitchen warfarin (COUMADIN) 2.5 MG tablet Take 2.5-5 mg by mouth daily. Takes one tablet for 2 days then takes two tablets for 1 day, then repeat      . levofloxacin (LEVAQUIN) 500 MG tablet Take 500 mg by mouth daily.      . Multiple Vitamin (MULTIVITAMIN) tablet Take 1 tablet by mouth daily.         Scheduled: . amLODipine  10 mg  Oral Daily  . bumetanide  1 mg Oral QODAY  . calcium-vitamin D  1 tablet Oral BID  . fentaNYL      . ferrous sulfate  325 mg Oral BID  . loratadine  10 mg Oral Daily  . multivitamin with minerals  1 tablet Oral Daily  . pantoprazole  40 mg Oral Daily   Continuous: . sodium chloride 75 mL/hr at 09/14/12 2331   WUJ:[id number], colchicine, HYDROcodone-acetaminophen, morphine injection  Results for orders placed during the hospital encounter of 09/14/12 (from the past 48 hour(s))  CBC     Status: Abnormal   Collection Time    09/14/12  4:09 PM      Result Value Range   WBC 14.7 (*) 4.0 - 10.5 K/uL   RBC 5.37 (*) 3.87 - 5.11 MIL/uL   Hemoglobin 16.3 (*) 12.0 - 15.0 g/dL   HCT 56.2 (*) 13.0 - 86.5 %   MCV 92.7  78.0 - 100.0 fL   MCH 30.4  26.0 - 34.0 pg   MCHC 32.7  30.0 -  36.0 g/dL   RDW 78.4  69.6 - 29.5 %   Platelets 248  150 - 400 K/uL  TROPONIN I     Status: None   Collection Time    09/14/12  4:09 PM      Result Value Range   Troponin I <0.30  <0.30 ng/mL   Comment:            Due to the release kinetics of cTnI,     a negative result within the first hours     of the onset of symptoms does not rule out     myocardial infarction with certainty.     If myocardial infarction is still suspected,     repeat the test at appropriate intervals.  BASIC METABOLIC PANEL     Status: Abnormal   Collection Time    09/14/12  4:09 PM      Result Value Range   Sodium 138  135 - 145 mEq/L   Potassium 4.0  3.5 - 5.1 mEq/L   Chloride 96  96 - 112 mEq/L   CO2 34 (*) 19 - 32 mEq/L   Glucose, Bld 132 (*) 70 - 99 mg/dL   BUN 23  6 - 23 mg/dL   Creatinine, Ser 2.84 (*) 0.50 - 1.10 mg/dL   Calcium 9.3  8.4 - 13.2 mg/dL   GFR calc non Af Amer 26 (*) >90 mL/min   GFR calc Af Amer 30 (*) >90 mL/min   Comment: (NOTE)     The eGFR has been calculated using the CKD EPI equation.     This calculation has not been validated in all clinical situations.     eGFR's persistently <90 mL/min signify possible Chronic Kidney     Disease.  PROTIME-INR     Status: Abnormal   Collection Time    09/14/12  4:09 PM      Result Value Range   Prothrombin Time 34.1 (*) 11.6 - 15.2 seconds   INR 3.54 (*) 0.00 - 1.49  URINALYSIS, ROUTINE W REFLEX MICROSCOPIC     Status: Abnormal   Collection Time    09/14/12  9:10 PM      Result Value Range   Color, Urine YELLOW  YELLOW   APPearance HAZY (*) CLEAR   Specific Gravity, Urine 1.015  1.005 - 1.030   pH 8.0  5.0 - 8.0   Glucose, UA NEGATIVE  NEGATIVE  mg/dL   Hgb urine dipstick NEGATIVE  NEGATIVE   Bilirubin Urine NEGATIVE  NEGATIVE   Ketones, ur TRACE (*) NEGATIVE mg/dL   Protein, ur TRACE (*) NEGATIVE mg/dL   Urobilinogen, UA 0.2  0.0 - 1.0 mg/dL   Nitrite NEGATIVE  NEGATIVE   Leukocytes, UA NEGATIVE  NEGATIVE  URINE  MICROSCOPIC-ADD ON     Status: Abnormal   Collection Time    09/14/12  9:10 PM      Result Value Range   WBC, UA 0-2  <3 WBC/hpf   Bacteria, UA MANY (*) RARE   Urine-Other AMORPHOUS URATES/PHOSPHATES      Dg Wrist Complete Right  09/14/2012   *RADIOLOGY REPORT*  Clinical Data: Pain and bruising post fall  RIGHT WRIST - COMPLETE 3+ VIEW  Comparison: 02/19/2005  Findings: Osseous demineralization. Degenerative changes at first [facility] joint and at STT joint. Minimal narrowing of radiocarpal joint. No acute fracture, dislocation, or bone destruction. Soft tissue calcification at the distal forearm is unchanged. No definite acute bony abnormalities seen.  IMPRESSION: Osseous demineralization with degenerative changes in wrist as above. No acute abnormalities.   Original Report Authenticated By: Ulyses Southward, M.D.   Dg Hip Complete Right  09/14/2012   *RADIOLOGY REPORT*  Clinical Data:  Fall  RIGHT HIP - COMPLETE 2+ VIEW  Comparison: None  Findings: Leg is rotated on AP view. Diffuse osseous demineralization. Questionable angular deformity at the junction of the head/neck. Unable to exclude right femoral neck fracture. No dislocation seen. Visualized pelvis appears intact.  IMPRESSION: Rotated exam. Question angular deformity at the junction of the right femoral head/neck, unable to exclude fracture; recommend CT right hip without contrast to evaluate.   Original Report Authenticated By: Ulyses Southward, M.D.   Dg Femur Right  09/14/2012   *RADIOLOGY REPORT*  Clinical Data:  Right femur and hip pain post fall  RIGHT FEMUR - 2 VIEW  Comparison: None.  Findings: Rotated on AP view. Questionable angular deformity at the junction of the right femoral head/neck, unable to exclude fracture. Remainder of the right femur appears intact. Bones diffusely demineralized. No additional fracture or dislocation.  IMPRESSION: Unable to exclude fracture at the junction of the right femoral head/neck; recommend CT imaging of the  right hip without contrast or further evaluation.   Original Report Authenticated By: Ulyses Southward, M.D.   Ct Head Wo Contrast  09/14/2012   *RADIOLOGY REPORT*  Clinical Data: Fall, headache  CT HEAD WITHOUT CONTRAST,CT CERVICAL SPINE WITHOUT CONTRAST  Technique:  Contiguous axial images were obtained from the base of the skull through the vertex without contrast.,Technique: Multidetector CT imaging of the cervical spine was performed. Multiplanar CT image reconstructions were also generated.  Comparison: None.  Findings: No skull fracture is noted.  There is a right peri orbital and right lateral extra orbital soft tissue swelling. Bilateral eye globe is symmetrical in appearance.  No intraorbital hematoma.  There is mucosal thickening posterior aspect of the left sphenoid sinus.  Atherosclerotic calcifications of carotid siphon are noted.  No intracranial hemorrhage, mass effect or midline shift.  Moderate cerebral atrophy.  Lacunar infarct is noted in the right basal ganglia.  Periventricular and subcortical white matter decreased attenuation probable due to chronic small vessel ischemic changes. No acute cortical infarction.  No mass lesion is noted on this unenhanced scan. Moderate cerebral atrophy.  Mild periventricular white matter decreased attenuation probable due to chronic small vessel ischemic changes.  IMPRESSION:  1.  There is right periorbital and  right lateral frontal soft tissue swelling.  No intraorbital hematoma. 2.  No skull fracture is noted. 3. Moderate cerebral atrophy.  Mild periventricular white matter decreased attenuation probable due to chronic small vessel ischemic changes.  No definite acute cortical infarction.  CT cervical spine without IV contrast:  Axial images of the cervical spine shows no acute fracture or subluxation.  There is no pneumothorax in visualized lung apices.  Computer processed images shows no acute fracture or subluxation. Atherosclerotic calcifications of thoracic  aorta and carotid arteries.  Diffuse osteopenia.  Degenerative changes thoracic articulation. Mild disc space flattening with mild posterior spurring at C5-C6 level.  Mild disc space flattening with mild anterior and moderate posterior spurring at C6-C7 level.  No prevertebral soft tissue swelling.  Cervical airway is patent.  Mild spinal canal stenosis due to posterior spurring at C6-C7 level.  Impression: 1.  No acute fracture or subluxation.  Diffuse osteopenia. Degenerative changes as described above.   Original Report Authenticated By: Natasha Mead, M.D.   Ct Cervical Spine Wo Contrast  09/14/2012   *RADIOLOGY REPORT*  Clinical Data: Fall, headache  CT HEAD WITHOUT CONTRAST,CT CERVICAL SPINE WITHOUT CONTRAST  Technique:  Contiguous axial images were obtained from the base of the skull through the vertex without contrast.,Technique: Multidetector CT imaging of the cervical spine was performed. Multiplanar CT image reconstructions were also generated.  Comparison: None.  Findings: No skull fracture is noted.  There is a right peri orbital and right lateral extra orbital soft tissue swelling. Bilateral eye globe is symmetrical in appearance.  No intraorbital hematoma.  There is mucosal thickening posterior aspect of the left sphenoid sinus.  Atherosclerotic calcifications of carotid siphon are noted.  No intracranial hemorrhage, mass effect or midline shift.  Moderate cerebral atrophy.  Lacunar infarct is noted in the right basal ganglia.  Periventricular and subcortical white matter decreased attenuation probable due to chronic small vessel ischemic changes. No acute cortical infarction.  No mass lesion is noted on this unenhanced scan. Moderate cerebral atrophy.  Mild periventricular white matter decreased attenuation probable due to chronic small vessel ischemic changes.  IMPRESSION:  1.  There is right periorbital and right lateral frontal soft tissue swelling.  No intraorbital hematoma. 2.  No skull fracture  is noted. 3. Moderate cerebral atrophy.  Mild periventricular white matter decreased attenuation probable due to chronic small vessel ischemic changes.  No definite acute cortical infarction.  CT cervical spine without IV contrast:  Axial images of the cervical spine shows no acute fracture or subluxation.  There is no pneumothorax in visualized lung apices.  Computer processed images shows no acute fracture or subluxation. Atherosclerotic calcifications of thoracic aorta and carotid arteries.  Diffuse osteopenia.  Degenerative changes thoracic articulation. Mild disc space flattening with mild posterior spurring at C5-C6 level.  Mild disc space flattening with mild anterior and moderate posterior spurring at C6-C7 level.  No prevertebral soft tissue swelling.  Cervical airway is patent.  Mild spinal canal stenosis due to posterior spurring at C6-C7 level.  Impression: 1.  No acute fracture or subluxation.  Diffuse osteopenia. Degenerative changes as described above.   Original Report Authenticated By: Natasha Mead, M.D.   Dg Pelvis Portable  09/14/2012   *RADIOLOGY REPORT*  Clinical Data: Fall.  Right hip pain  PORTABLE PELVIS  Comparison: 03/28/2011  Findings: No definite fracture.  If the patient has  significant right hip pain and cannot ambulate, further dedicated imaging of the right hip is suggested.  There is  mild degenerative change in the right hip and moderate degenerative change and spurring in the left hip.  IMPRESSION: No definite displaced fracture is identified.  Further detailed imaging of the right hip may be helpful if the patient has persistent pain.   Original Report Authenticated By: Janeece Riggers, M.D.   Ct Hip Right Wo Contrast  09/14/2012   *RADIOLOGY REPORT*  Clinical Data: Right hip pain secondary to a fall today.  CT OF THE RIGHT HIP WITHOUT CONTRAST  Technique:  Multidetector CT imaging was performed according to the standard protocol. Multiplanar CT image reconstructions were also  generated.  Comparison: Radiographs dated 09/14/2012  Findings: There is an impacted slightly angulated fracture of the subcapital region of the right femoral neck.  Minimal displacement.  Moderate arthritic changes of the right hip.  No other abnormality.  IMPRESSION: Subcapital fracture of the right femoral neck.   Original Report Authenticated By: Francene Boyers, M.D.   Dg Chest Port 1 View  09/14/2012   *RADIOLOGY REPORT*  Clinical Data: Fall  PORTABLE CHEST - 1 VIEW  Comparison: 07/29/2012  Findings: Cardiac enlargement.  Large hiatal hernia overlying the heart is unchanged.  There is left lower lobe atelectasis.  Negative for heart failure or pneumonia.  IMPRESSION: Large left hiatal hernia with left lower lobe atelectasis.  No acute abnormality.   Original Report Authenticated By: Janeece Riggers, M.D.     Blood pressure 144/88, pulse 78, temperature 98.3 F (36.8 C), temperature source Oral, resp. rate 18, height 5\' 2"  (1.575 m), weight 86.2 kg (190 lb 0.6 oz), SpO2 94.00%.   Orthopaedic Exam: Right leg is shortened minimally, less than one inch. Right leg is warm, DP puilse 1 + and PT 1+. Right leg sensory And motor is intact. Exquisite pain right hip with any movement of the right leg. External rotation of the right leg with the right foot laterally Rotated. Xray with right subcapital hip fracture.  Assessment/Plan: Right subcapital hip fracture. Household ambulator. Anticoagulation for history of recent Pulmonary Embolus. Osteoporosis  Plan: Check INR serially and discontinue anticoagulation when INR less than or equal to 1.7 then right hip hemiarthroplasty For right hip fracture. Probable short term SNF post hospitalization.  Armani Gawlik E 09/15/2012, 8:45 AM

## 2012-09-15 NOTE — Care Management Note (Addendum)
    Page 1 of 1   09/21/2012     2:35:08 PM   CARE MANAGEMENT NOTE 09/21/2012  Patient:  [name], [name]   Account Number:  [id number]  Date Initiated:  09/15/2012  Documentation initiated by:  Tera Mater  Subjective/Objective Assessment:   79yo female admitted S/P fall from home.     Action/Plan:   discharge planning   Anticipated DC Date:  09/21/2012   Anticipated DC Plan:  SKILLED NURSING FACILITY  In-house referral  Clinical Social Worker      DC Planning Services  CM consult      Choice offered to / List presented to:             Status of service:  Completed, signed off Medicare Important Message given?   (If response is "NO", the following Medicare IM given date fields will be blank) Date Medicare IM given:   Date Additional Medicare IM given:    Discharge Disposition:  SKILLED NURSING FACILITY  Per UR Regulation:  Reviewed for med. necessity/level of care/duration of stay  If discussed at Long Length of Stay Meetings, dates discussed:   09/20/2012    Comments:  09/21/12 14:33 Letha Cape RN, BSN [phone] patient for dc to [facility] today, CSW following.  09/20/12- 1230- Donn Pierini RN, BSN [phone] CSW following for SNF placement- pt post op hip repair with resp issues post op now on 4L.  09/15/12 1540 Noted CM referral for SNF placement.  Pt. fell at home and may require SNF placement after surgery.  CSW consulted. Tera Mater, RN, BSN NCM [phone]

## 2012-09-16 ENCOUNTER — Inpatient Hospital Stay (HOSPITAL_COMMUNITY): Payer: Medicare Other

## 2012-09-16 ENCOUNTER — Encounter (HOSPITAL_COMMUNITY): Payer: Self-pay | Admitting: Anesthesiology

## 2012-09-16 ENCOUNTER — Encounter (HOSPITAL_COMMUNITY)
Admission: EM | Disposition: A | Discharge status: Skilled Nursing Facility | Payer: Self-pay | Source: Home / Self Care | Attending: Internal Medicine

## 2012-09-16 ENCOUNTER — Inpatient Hospital Stay (HOSPITAL_COMMUNITY): Payer: Medicare Other | Admitting: Anesthesiology

## 2012-09-16 DIAGNOSIS — D72829 Elevated white blood cell count, unspecified: Secondary | ICD-10-CM

## 2012-09-16 DIAGNOSIS — I4891 Unspecified atrial fibrillation: Secondary | ICD-10-CM

## 2012-09-16 DIAGNOSIS — I482 Chronic atrial fibrillation, unspecified: Secondary | ICD-10-CM | POA: Diagnosis present

## 2012-09-16 HISTORY — PX: HIP ARTHROPLASTY: SHX981

## 2012-09-16 LAB — COMPREHENSIVE METABOLIC PANEL
Albumin: 2.6 g/dL — ABNORMAL LOW (ref 3.5–5.2)
Alkaline Phosphatase: 61 U/L (ref 39–117)
BUN: 21 mg/dL (ref 6–23)
Calcium: 8.4 mg/dL (ref 8.4–10.5)
Creatinine, Ser: 1.56 mg/dL — ABNORMAL HIGH (ref 0.50–1.10)
Glucose, Bld: 120 mg/dL — ABNORMAL HIGH (ref 70–99)
Potassium: 3.9 mEq/L (ref 3.5–5.1)
Total Protein: 6 g/dL (ref 6.0–8.3)

## 2012-09-16 LAB — SURGICAL PCR SCREEN: MRSA, PCR: POSITIVE — AB

## 2012-09-16 LAB — CBC
Hemoglobin: 14.8 g/dL (ref 12.0–15.0)
MCH: 30.9 pg (ref 26.0–34.0)
MCHC: 34 g/dL (ref 30.0–36.0)
Platelets: 197 10*3/uL (ref 150–400)

## 2012-09-16 LAB — BASIC METABOLIC PANEL
BUN: 21 mg/dL (ref 6–23)
Calcium: 8.4 mg/dL (ref 8.4–10.5)
Creatinine, Ser: 1.49 mg/dL — ABNORMAL HIGH (ref 0.50–1.10)
GFR calc Af Amer: 33 mL/min — ABNORMAL LOW (ref >90)
GFR calc non Af Amer: 29 mL/min — ABNORMAL LOW (ref >90)

## 2012-09-16 LAB — PROTIME-INR
INR: 1.29 (ref 0.00–1.49)
Prothrombin Time: 15.8 seconds — ABNORMAL HIGH (ref 11.6–15.2)

## 2012-09-16 SURGERY — HEMIARTHROPLASTY, HIP, DIRECT ANTERIOR APPROACH, FOR FRACTURE
Anesthesia: General | Site: Hip | Laterality: Right | Wound class: Clean

## 2012-09-16 MED ORDER — WARFARIN - PHARMACIST DOSING INPATIENT
Freq: Every day | Status: DC
  Administered 2012-09-19: 18:00:00

## 2012-09-16 MED ORDER — ONDANSETRON HCL 4 MG PO TABS
4.0000 mg | ORAL_TABLET | Freq: Four times a day (QID) | ORAL | Status: DC | PRN
Start: 2012-09-16 — End: 2012-09-21
  Filled 2012-09-16: qty 1

## 2012-09-16 MED ORDER — ROCURONIUM BROMIDE 100 MG/10ML IV SOLN
INTRAVENOUS | Status: DC | PRN
  Administered 2012-09-16: 10 mg via INTRAVENOUS
  Administered 2012-09-16: 40 mg via INTRAVENOUS

## 2012-09-16 MED ORDER — NALOXONE HCL 0.4 MG/ML IJ SOLN
0.2000 mg | INTRAMUSCULAR | Status: DC | PRN
  Administered 2012-09-16: 0.2 mg via INTRAVENOUS

## 2012-09-16 MED ORDER — FENTANYL CITRATE 0.05 MG/ML IJ SOLN
INTRAMUSCULAR | Status: DC | PRN
  Administered 2012-09-16 (×3): 50 ug via INTRAVENOUS

## 2012-09-16 MED ORDER — CEFAZOLIN SODIUM-DEXTROSE 2-3 GM-% IV SOLR
2.0000 g | INTRAVENOUS | Status: DC
  Filled 2012-09-16: qty 50

## 2012-09-16 MED ORDER — METOCLOPRAMIDE HCL 5 MG/ML IJ SOLN
5.0000 mg | Freq: Three times a day (TID) | INTRAMUSCULAR | Status: DC | PRN
  Filled 2012-09-16: qty 2

## 2012-09-16 MED ORDER — CEFAZOLIN SODIUM-DEXTROSE 2-3 GM-% IV SOLR
2.0000 g | INTRAVENOUS | Status: AC
  Administered 2012-09-16: 2 g via INTRAVENOUS
  Filled 2012-09-16: qty 50

## 2012-09-16 MED ORDER — FLEET ENEMA 7-19 GM/118ML RE ENEM: 1.0000 | ENEMA | Freq: Once | RECTAL | Status: AC | PRN

## 2012-09-16 MED ORDER — MUPIROCIN 2 % EX OINT
1.0000 "application " | TOPICAL_OINTMENT | Freq: Two times a day (BID) | CUTANEOUS | Status: AC
  Administered 2012-09-16 – 2012-09-20 (×10): 1 via NASAL
  Filled 2012-09-16: qty 22

## 2012-09-16 MED ORDER — GLYCOPYRROLATE 0.2 MG/ML IJ SOLN
INTRAMUSCULAR | Status: DC | PRN
  Administered 2012-09-16: 0.2 mg via INTRAVENOUS

## 2012-09-16 MED ORDER — DEXTROSE 5 % IV SOLN
INTRAVENOUS | Status: DC | PRN
  Administered 2012-09-16: 13:00:00 via INTRAVENOUS

## 2012-09-16 MED ORDER — ACETAMINOPHEN 500 MG PO TABS
1000.0000 mg | ORAL_TABLET | Freq: Four times a day (QID) | ORAL | Status: DC | PRN
  Administered 2012-09-17 – 2012-09-19 (×6): 1000 mg via ORAL
  Administered 2012-09-20 (×2): 975 mg via ORAL
  Filled 2012-09-16 (×5): qty 2

## 2012-09-16 MED ORDER — BISACODYL 10 MG RE SUPP: 10.0000 mg | Freq: Every day | RECTAL | Status: DC | PRN

## 2012-09-16 MED ORDER — SODIUM CHLORIDE 0.9 % IV SOLN
INTRAVENOUS | Status: DC
  Administered 2012-09-17: 06:00:00 via INTRAVENOUS

## 2012-09-16 MED ORDER — ACETAMINOPHEN 650 MG RE SUPP: 650.0000 mg | Freq: Four times a day (QID) | RECTAL | Status: DC | PRN

## 2012-09-16 MED ORDER — MENTHOL 3 MG MT LOZG
1.0000 | LOZENGE | OROMUCOSAL | Status: DC | PRN
  Filled 2012-09-16: qty 9

## 2012-09-16 MED ORDER — PROPOFOL 10 MG/ML IV BOLUS
INTRAVENOUS | Status: DC | PRN
  Administered 2012-09-16: 50 mg via INTRAVENOUS

## 2012-09-16 MED ORDER — ONDANSETRON HCL 4 MG/2ML IJ SOLN
4.0000 mg | Freq: Four times a day (QID) | INTRAMUSCULAR | Status: DC | PRN
  Filled 2012-09-16: qty 2

## 2012-09-16 MED ORDER — NEOSTIGMINE METHYLSULFATE 1 MG/ML IJ SOLN
INTRAMUSCULAR | Status: DC | PRN
  Administered 2012-09-16: 2 mg via INTRAVENOUS

## 2012-09-16 MED ORDER — METOCLOPRAMIDE HCL 5 MG PO TABS
5.0000 mg | ORAL_TABLET | Freq: Three times a day (TID) | ORAL | Status: DC | PRN
  Filled 2012-09-16: qty 2

## 2012-09-16 MED ORDER — ARTIFICIAL TEARS OP OINT
TOPICAL_OINTMENT | OPHTHALMIC | Status: DC | PRN
  Administered 2012-09-16: 1 via OPHTHALMIC

## 2012-09-16 MED ORDER — ONDANSETRON HCL 4 MG/2ML IJ SOLN
INTRAMUSCULAR | Status: DC | PRN
  Administered 2012-09-16: 4 mg via INTRAVENOUS

## 2012-09-16 MED ORDER — PHENOL 1.4 % MT LIQD: 1.0000 | OROMUCOSAL | Status: DC | PRN

## 2012-09-16 MED ORDER — CHLORHEXIDINE GLUCONATE CLOTH 2 % EX PADS
6.0000 | MEDICATED_PAD | Freq: Every day | CUTANEOUS | Status: AC
  Administered 2012-09-17 – 2012-09-20 (×4): 6 via TOPICAL

## 2012-09-16 MED ORDER — HEPARIN SODIUM (PORCINE) 5000 UNIT/ML IJ SOLN
5000.0000 [IU] | Freq: Three times a day (TID) | INTRAMUSCULAR | Status: DC
  Administered 2012-09-16 – 2012-09-21 (×14): 5000 [IU] via SUBCUTANEOUS
  Filled 2012-09-16 (×17): qty 1

## 2012-09-16 MED ORDER — SODIUM CHLORIDE 0.9 % IR SOLN
Status: DC | PRN
  Administered 2012-09-16: 1000 mL

## 2012-09-16 MED ORDER — DOCUSATE SODIUM 100 MG PO CAPS
100.0000 mg | ORAL_CAPSULE | Freq: Two times a day (BID) | ORAL | Status: DC
  Administered 2012-09-17 – 2012-09-21 (×9): 100 mg via ORAL
  Filled 2012-09-16 (×9): qty 1

## 2012-09-16 MED ORDER — WARFARIN SODIUM 5 MG PO TABS
5.0000 mg | ORAL_TABLET | Freq: Once | ORAL | Status: AC
  Filled 2012-09-16: qty 1

## 2012-09-16 MED ORDER — LIDOCAINE HCL (CARDIAC) 20 MG/ML IV SOLN
INTRAVENOUS | Status: DC | PRN
  Administered 2012-09-16: 40 mg via INTRAVENOUS

## 2012-09-16 MED ORDER — LACTATED RINGERS IV SOLN
INTRAVENOUS | Status: DC | PRN
  Administered 2012-09-16 (×3): via INTRAVENOUS

## 2012-09-16 MED ORDER — CHLORHEXIDINE GLUCONATE 4 % EX LIQD
60.0000 mL | Freq: Once | CUTANEOUS | Status: DC
  Filled 2012-09-16: qty 60

## 2012-09-16 MED ORDER — ACETAMINOPHEN 325 MG PO TABS: 650.0000 mg | ORAL_TABLET | Freq: Four times a day (QID) | ORAL | Status: DC | PRN

## 2012-09-16 MED ORDER — ALUMINUM HYDROXIDE GEL 320 MG/5ML PO SUSP
15.0000 mL | ORAL | Status: DC | PRN
  Filled 2012-09-16: qty 30

## 2012-09-16 MED ORDER — POLYETHYLENE GLYCOL 3350 17 G PO PACK
17.0000 g | PACK | Freq: Every day | ORAL | Status: DC | PRN
  Administered 2012-09-17: 17 g via ORAL
  Filled 2012-09-16: qty 1

## 2012-09-16 MED ORDER — LACTATED RINGERS IV SOLN: INTRAVENOUS | Status: DC

## 2012-09-16 SURGICAL SUPPLY — 67 items
BENZOIN TINCTURE PRP APPL 2/3 (GAUZE/BANDAGES/DRESSINGS) ×2 IMPLANT
BLADE SAW SAG 73X25 THK (BLADE) ×1
BLADE SAW SGTL 73X25 THK (BLADE) ×1 IMPLANT
BRUSH FEMORAL CANAL (MISCELLANEOUS) IMPLANT
CAPT HIP FX BIPOLAR/UNIPOLAR ×2 IMPLANT
CLOTH BEACON ORANGE TIMEOUT ST (SAFETY) ×2 IMPLANT
COVER SURGICAL LIGHT HANDLE (MISCELLANEOUS) ×4 IMPLANT
COVER TABLE BACK 60X90 (DRAPES) ×2 IMPLANT
DECANTER SPIKE VIAL GLASS SM (MISCELLANEOUS) ×2 IMPLANT
DRAPE INCISE IOBAN 66X45 STRL (DRAPES) ×6 IMPLANT
DRAPE ORTHO SPLIT 77X108 STRL (DRAPES) ×2
DRAPE SURG 17X23 STRL (DRAPES) ×2 IMPLANT
DRAPE SURG ORHT 6 SPLT 77X108 (DRAPES) ×2 IMPLANT
DRAPE U-SHAPE 47X51 STRL (DRAPES) ×2 IMPLANT
DRILL BIT 7/64X5 (BIT) ×2 IMPLANT
DRSG ADAPTIC 3X8 NADH LF (GAUZE/BANDAGES/DRESSINGS) ×2 IMPLANT
DRSG MEPILEX BORDER 4X8 (GAUZE/BANDAGES/DRESSINGS) ×2 IMPLANT
DRSG TEGADERM 4X4.75 (GAUZE/BANDAGES/DRESSINGS) ×2 IMPLANT
DURAPREP 26ML APPLICATOR (WOUND CARE) ×2 IMPLANT
ELECT BLADE 6.5 EXT (BLADE) ×2 IMPLANT
ELECT REM PT RETURN 9FT ADLT (ELECTROSURGICAL) ×2
ELECTRODE REM PT RTRN 9FT ADLT (ELECTROSURGICAL) ×1 IMPLANT
EVACUATOR 1/8 PVC DRAIN (DRAIN) ×2 IMPLANT
FACESHIELD LNG OPTICON STERILE (SAFETY) ×2 IMPLANT
GAUZE SPONGE 2X2 8PLY STRL LF (GAUZE/BANDAGES/DRESSINGS) ×1 IMPLANT
GLOVE BIO SURGEON STRL SZ7.5 (GLOVE) ×2 IMPLANT
GLOVE BIOGEL PI IND STRL 6 (GLOVE) ×1 IMPLANT
GLOVE BIOGEL PI IND STRL 7.5 (GLOVE) ×2 IMPLANT
GLOVE BIOGEL PI INDICATOR 6 (GLOVE) ×1
GLOVE BIOGEL PI INDICATOR 7.5 (GLOVE) ×2
GLOVE ECLIPSE 7.0 STRL STRAW (GLOVE) ×2 IMPLANT
GLOVE ECLIPSE 8.5 STRL (GLOVE) ×4 IMPLANT
GLOVE SURG 8.5 LATEX PF (GLOVE) ×4 IMPLANT
GLOVE SURG SS PI 7.0 STRL IVOR (GLOVE) ×2 IMPLANT
GOWN PREVENTION PLUS LG XLONG (DISPOSABLE) IMPLANT
GOWN PREVENTION PLUS XXLARGE (GOWN DISPOSABLE) ×2 IMPLANT
GOWN STRL NON-REIN LRG LVL3 (GOWN DISPOSABLE) ×8 IMPLANT
HANDPIECE INTERPULSE COAX TIP (DISPOSABLE)
IMMOBILIZER KNEE 20 (SOFTGOODS) ×2
IMMOBILIZER KNEE 20 THIGH 36 (SOFTGOODS) ×1 IMPLANT
KIT BASIN OR (CUSTOM PROCEDURE TRAY) ×2 IMPLANT
KIT ROOM TURNOVER OR (KITS) ×2 IMPLANT
MANIFOLD NEPTUNE II (INSTRUMENTS) ×2 IMPLANT
NEEDLE MAYO TROCAR (NEEDLE) ×2 IMPLANT
NS IRRIG 1000ML POUR BTL (IV SOLUTION) ×2 IMPLANT
PACK TOTAL JOINT (CUSTOM PROCEDURE TRAY) ×2 IMPLANT
PAD ARMBOARD 7.5X6 YLW CONV (MISCELLANEOUS) ×4 IMPLANT
PASSER SUT SWANSON 36MM LOOP (INSTRUMENTS) ×2 IMPLANT
PILLOW ABDUCTION HIP (SOFTGOODS) IMPLANT
SET HNDPC FAN SPRY TIP SCT (DISPOSABLE) IMPLANT
SPONGE GAUZE 2X2 STER 10/PKG (GAUZE/BANDAGES/DRESSINGS) ×1
SPONGE LAP 4X18 X RAY DECT (DISPOSABLE) ×2 IMPLANT
STAPLER VISISTAT 35W (STAPLE) ×2 IMPLANT
STRIP CLOSURE SKIN 1/2X4 (GAUZE/BANDAGES/DRESSINGS) ×4 IMPLANT
SUCTION FRAZIER TIP 10 FR DISP (SUCTIONS) ×2 IMPLANT
SUT ETHIBOND NAB CT1 #1 30IN (SUTURE) ×12 IMPLANT
SUT VIC AB 0 CTXB 36 (SUTURE) ×4 IMPLANT
SUT VIC AB 1 CT1 27 (SUTURE) ×2
SUT VIC AB 1 CT1 27XBRD ANBCTR (SUTURE) ×2 IMPLANT
SUT VIC AB 2-0 CT1 27 (SUTURE) ×2
SUT VIC AB 2-0 CT1 TAPERPNT 27 (SUTURE) ×2 IMPLANT
SUT VICRYL 4-0 PS2 18IN ABS (SUTURE) ×2 IMPLANT
TOWEL OR 17X24 6PK STRL BLUE (TOWEL DISPOSABLE) ×2 IMPLANT
TOWEL OR 17X26 10 PK STRL BLUE (TOWEL DISPOSABLE) ×2 IMPLANT
TOWER CARTRIDGE SMART MIX (DISPOSABLE) IMPLANT
TRAY FOLEY CATH 16FRSI W/METER (SET/KITS/TRAYS/PACK) IMPLANT
WATER STERILE IRR 1000ML POUR (IV SOLUTION) ×8 IMPLANT

## 2012-09-16 NOTE — Transfer of Care (Signed)
Immediate Anesthesia Transfer of Care Note  Patient: [name]  Procedure(s) Performed: Procedure(s): ARTHROPLASTY BIPOLAR HIP (Right)  Patient Location: PACU  Anesthesia Type:General  Level of Consciousness: awake, alert , oriented and patient cooperative  Airway & Oxygen Therapy: Patient Spontanous Breathing and Patient connected to nasal cannula oxygen  Post-op Assessment: Report given to PACU RN and Post -op Vital signs reviewed and stable  Post vital signs: Reviewed and stable  Complications: No apparent anesthesia complications

## 2012-09-16 NOTE — Progress Notes (Signed)
Anesthesiology Post-op:  [name] is a 79 year old female with a history of atrial fibrillation and pulmonary embolus to the right lung in July 2014 who was admitted on 09/15/2011 with a right femoral neck fracture. She underwent R. Hip  Bipolar Arthroplasty by Dr. Otelia Sergeant today under general anesthesia. The surgery was uneventful and  she was extubated in the operating room. She was then brought to the recovery room and was noted to be apneic with oxygen saturation of 45%. She was bagged with an Ambu bag and 200 mcg. of Narcan. She subsequently regained consciousness and her sat improved from 45 to 97%. She was then placed on a nonrebreather mask. She is now following commands and appears to have adequate respirations.  Chest x-ray showed an elevated left hemidiaphragm and mild perihilar infiltrates.  Vital signs: T-36.5 RR 22 BP 125/65 heart rate 91 sinus rhythm  O2 sat 96% on 10 L nonrebreather mask.  Heart regular rate and rhythm with no murmurs  Lungs decreased breath sounds bilaterally but no wheezing or crackles  Impression: 79 year-old female with recent history of pulmonary embolism and status post ORIF of right femoral neck fracture with postop hypoxemia and apnea. Suspect secondary to respiratory depression from anesthetic agents and underlying debilitated condition. She appears clinically stable at present  Plan: 1. Monitor carefully for renarcotization. 2. Admit to step down 3300. Her condition discussed with Dr. Carolyne Littles with [facility] who are following her in the hospital. Dr. Joseph Art will see her in the step down unit. I will update her family.  Kipp Brood, MD

## 2012-09-16 NOTE — Brief Op Note (Signed)
09/14/2012 - 09/16/2012  3:53 PM  PATIENT:  [name]  79 y.o. female  PRE-OPERATIVE DIAGNOSIS:  RIGHT HIP FRACTURE  POST-OPERATIVE DIAGNOSIS:  RIGHT HIP FRACTURE  PROCEDURE: Right  Hip Press Fit Unipolar Hemiarthroplasty, #5 femoral stem, +0 neck  47mm Unipolar Head.  SURGEON:  Surgeon(s) and Role: Kerrin Champagne, MD - Primary  PHYSICIAN ASSISTANT: Velna Hatchet RNFA  ANESTHESIA:   general  EBL:  Total I/O In: 2050 [I.V.:2050] Out: 375 [Urine:125; Blood:250]  BLOOD ADMINISTERED:none  DRAINS: (One medium) Hemovact drain(s) in the right lateral thigh proximal femur. with  Suction Open and Urinary Catheter (Foley)   LOCAL MEDICATIONS USED:  NONE  SPECIMEN:  No Specimen  DISPOSITION OF SPECIMEN:  N/A  COUNTS:  YES  TOURNIQUET:  * No tourniquets in log *  DICTATION: .Dragon Dictation  PLAN OF CARE: Admit to inpatient   PATIENT DISPOSITION:  PACU - hemodynamically stable.   Delay start of Pharmacological VTE agent (>24hrs) due to surgical blood loss or risk of bleeding: no

## 2012-09-16 NOTE — Preoperative (Signed)
Beta Blockers   Reason not to administer Beta Blockers:Not Applicable 

## 2012-09-16 NOTE — Progress Notes (Signed)
[facility] History and Physical  [name]:[id number] DOB: [birth year] DOA: 09/14/2012  Referring physician: ER. PCP: Milana Obey, MD    Chief Complaint: Right hip pain.  HPI: [name] is a 79 y.o. WF PMHx HTN, HLD, hiatal hernia, PE right lung (July 2014), atrial fibrillation, CK-MB stage III. Presented , to ED 2dary to fall today approximately 1:30 PM Dx Rt hip fracture, . The fall was accidental in nature. The patient denies any dizziness, palpitations, chest pain, dyspnea, loss of consciousness associated with this fall. The family, who are present in the room, denied any other abnormalities noticed about the patient, especially no evidence of confusion.   She has no other history of coronary artery disease or cerebrovascular disease. She is no lung disease other than the pulmonary embolism. She is on chronic anticoagulation with Coumadin. She now complains of right hip pain, as would be expected. She has been found to have a right hip fracture on CT scanning. Pt has been seen by Dr. Guy Sandifer. Nitka([facility]) plans on performing surgery in the a.m.TODAY  S/P right hip surgery complicated by decreased O2 saturation secondary to anesthesia patient was given Narcan and immediately woke up per surgery notes. Patient didn't step down unit as a precaution.      Procedure CT right hip without contrast 09/14/2012 Subcapital fracture of the right femoral neck   Past Medical History  Diagnosis Date  . Hyperlipidemia, mild   . GERD (gastroesophageal reflux disease)   . Essential hypertension   . Chest pain   . Pneumonia    Past Surgical History  Procedure Laterality Date  . Appendectomy     Social History:  Patient lives by herself and is apparently fairly independent. She walks with a stroller. She does not smoke. She does not drink alcohol.   No Known Allergies  Family History  Problem Relation Age of Onset  . Brain cancer Mother   . Heart  disease Father   . Heart attack Brother     Prior to Admission medications   Medication Sig Start Date End Date Taking? Authorizing Provider  ALPRAZolam Prudy Feeler) 0.5 MG tablet Take 0.5 mg by mouth See admin instructions. TAKE ONE TABLET BY MOUTH UP TO TWICE DAILY FOR ANXIETY. Patient only takes one-half tablet twice daily as needed for anxiety   Yes Historical Provider, MD  amLODipine (NORVASC) 10 MG tablet Take 10 mg by mouth daily.     Yes Historical Provider, MD  bumetanide (BUMEX) 1 MG tablet Take 1 mg by mouth every other day.    Yes Historical Provider, MD  calcium-vitamin D (OSCAL WITH D) 500-200 MG-UNIT per tablet Take 1 tablet by mouth 2 (two) times daily.    Yes Historical Provider, MD  cetirizine (ZYRTEC) 10 MG tablet Take 10 mg by mouth daily. For allergy symptoms   Yes Historical Provider, MD  colchicine 0.6 MG tablet Take 0.6 mg by mouth daily as needed. For gout   Yes Historical Provider, MD  ferrous sulfate (IRON SUPPLEMENT) 325 (65 FE) MG tablet Take 325 mg by mouth 2 (two) times daily.    Yes Historical Provider, MD  pantoprazole (PROTONIX) 40 MG tablet Take 1 tablet (40 mg total) by mouth daily. 08/03/12  Yes Angus Edilia Bo, MD  potassium chloride (K-DUR,KLOR-CON) 10 MEQ tablet Take 10 mEq by mouth 2 (two) times daily.    Yes Historical Provider, MD  vitamin C (ASCORBIC ACID) 500 MG tablet Take 500 mg by mouth daily.  Yes Historical Provider, MD  warfarin (COUMADIN) 2.5 MG tablet Take 2.5-5 mg by mouth daily. Takes one tablet for 2 days then takes two tablets for 1 day, then repeat   Yes Historical Provider, MD  levofloxacin (LEVAQUIN) 500 MG tablet Take 500 mg by mouth daily. 08/30/12   Historical Provider, MD  Multiple Vitamin (MULTIVITAMIN) tablet Take 1 tablet by mouth daily.      Historical Provider, MD   Physical Exam: Filed Vitals:   09/16/12 2000  BP: 115/78  Pulse:   Temp:   Resp:      General:  Alert, NAD, bruise over her right eye.  Cardiovascular: A-  fibrillation with a ventricular rate of 90-100. There is no evidence of heart failure. No murmurs.  Respiratory: Lung fields are clear.  Abdomen: Soft, nontender. No hepatosplenomegaly.    Labs on Admission:  Basic Metabolic Panel:  Recent Labs Lab 09/14/12 1609 09/16/12 0925 09/16/12 1100  NA 138 138 136  K 4.0 3.9 4.0  CL 96 100 100  CO2 34* 28 27  GLUCOSE 132* 120* 108*  BUN 23 21 21   CREATININE 1.63* 1.56* 1.49*  CALCIUM 9.3 8.4 8.4       CBC:  Recent Labs Lab 09/14/12 1609 09/15/12 2038 09/16/12 1100  WBC 14.7* 12.5* 13.6*  HGB 16.3* 15.4* 14.8  HCT 49.8* 45.2 43.5  MCV 92.7 91.5 90.8  PLT 248 200 197   Cardiac Enzymes:  Recent Labs Lab 09/14/12 1609  TROPONINI <0.30    BNP (last 3 results)  Recent Labs  03/19/12 1538 07/29/12 1421  PROBNP 445.4 427.6   CBG: No results found for this basename: GLUCAP,  in the last 168 hours  Radiological Exams on Admission: Dg Pelvis Portable  09/16/2012   *RADIOLOGY REPORT*  Clinical Data: Postop right hip  PORTABLE PELVIS  Comparison: 09/14/2012  Findings: The patient is status post operative repair of the right femoral neck fracture. There is right hip prosthesis  in anatomic alignment.  Postsurgical changes are noted with the lateral skin staples.  A surgical drain in place is noted.  IMPRESSION: Right hip prosthesis in anatomic alignment.  Postsurgical changes are noted.   Original Report Authenticated By: Natasha Mead, M.D.   Dg Chest Port 1 View  09/16/2012   *RADIOLOGY REPORT*  Clinical Data: Postoperative hypoxemia following hip surgery, hard 10 waking up from anesthesia, apneic episode  PORTABLE CHEST - 1 VIEW  Comparison: Portable exam 1626 hours compared to 09/14/2012 Correlation:  CT chest 07/29/2012  Findings: Enlargement of cardiac silhouette with pulmonary vascular congestion. Atherosclerotic calcification aorta. Perihilar infiltrates question edema and CHF. Bowel identified at the inferior left  hemithorax corresponding to a markedly elevated left hemi diaphragm and large hiatal hernia as better visualized on recent CT. No gross pleural effusion or pneumothorax. Bones demineralized.  IMPRESSION: Enlargement of cardiac silhouette with pulmonary vascular congestion and perihilar infiltrates question pulmonary edema/CHF. Markedly elevated left diaphragm as well as a large hiatal hernia.   Original Report Authenticated By: Ulyses Southward, M.D.    EKG: Independently reviewed. Atrial fibrillation. No acute ST-T wave changes.  Assessment/Plan Principal Problem:   Hip fracture, right Active Problems:   HYPERLIPIDEMIA, MILD   Unspecified essential hypertension   HIATAL HERNIA   Pulmonary emboli   CKD (chronic kidney disease) stage 3, GFR 30-59 ml/min   Leukocytosis, unspecified   Chronic atrial fibrillation   1. Right hip fracture. Dr. Guy Sandifer. Nitka([facility]). Performed surgery on 8/22; ARTHROPLASTY UNIPOLAR HEMIARTHROPLASTY RIGHT HIP,  DEPUY PRESS FIT #5 FEMORAL STEM, +0 NECK, HEAD. --Patient states pain well controlled  2. Chronic atrial fibrillation. Currently in normal sinus rhythm  3. History of pulmonary embolus Coumadin will be restarted per surgery  4. Hypertension. Within normal limits  5.  Chronic kidney disease stage III. mildly elevated (baseline creatinine = 1.44) possibly patient is slightly dehydrated. Currently on hydration per surgery protocol. Current creatinine 1.49    Code Status: DO NOT RESUSCITATE. This was confirmed with the patient at the bedside. Family members also were present at the bedside and they agree.   Disposition Plan: Depending on progress. Will likely need rehabilitation in a skilled nursing facility.   Time spent: 30 minutes.  Drema Dallas [facility] Pager [phone].  If 7PM-7AM, please contact night-coverage [email or web address] Password [facility] 09/16/2012, 8:44 PM

## 2012-09-16 NOTE — Op Note (Signed)
09/14/2012 - 09/16/2012  4:41 PM  PATIENT:  [name]  79 y.o. female  MRN: [id number]  OPERATIVE REPORT  PRE-OPERATIVE DIAGNOSIS:  RIGHT HIP FRACTURE  POST-OPERATIVE DIAGNOSIS:  RIGHT HIP FRACTURE  PROCEDURE:  Procedure(s): ARTHROPLASTY UNIPOLAR HEMIARTHROPLASTY RIGHT HIP, DEPUY PRESS FIT #5 FEMORAL STEM, +0 NECK, HEAD.    SURGEON:  Kerrin Champagne, MD     ASSISTANT:  Velna Hatchet RNFA    ANESTHESIA:  General, Dr. Michelle Piper Dr. Noreene Larsson    COMPLICATIONS:  Greater trochanter fracture treated with 2 x 18 gauge wire fixation.     COMPONENTS:Depuy Press Fit #5 Femoral Stem, +0 Neck 47mm Head. 2x 18 gauge Trochanteric wire fixation.  PROCEDURE IN DETAIL: The patient was met in the holding area and  identified. The appropriate right  hip was identified and marked at the operative site. Preoperative antibiotics 2 g Ancef were given . The patient was then transported to the OR and  placed under general l anesthesia. At that point, the patient was  placed in the lateral decubitus position with the operative right tside up and  secured to the operating room table with the Innomed hip system.  The operative lower extremity was prepped from the iliac crest to the distal  leg with DuraPrep. Sterile draping was  performed.  A routine southern incision was utilized and via sharp dissection  carried down to the subcutaneous tissue. Gross bleeders were Bovie  coagulated. The iliotibial band was quickly identified and incised  along the length of the skin incision. Self-retaining retractors were  inserted. With the hip internally rotated, the short external rotators  were identified. Tendinous structures were tagged with 0 Ethibond  suture. The hip capsule was identified and incised along the femoral  neck and head. Femoral neck fracture was identified displaced posteriorly and into varus angulation. The femoral neck was then osteotomized using a  calcar guide and femoral head removed from  the acetabulum using a corkscrew.The femoral neck osteotomy was placed about 1 fingerbreadth proximal to the lesser trochanter. Acetabulum was then carefully debrided of bony fracture  fragments that were attached to synovium. Irrigation was carried out of the acetabulum any small fragments removed. The acetabulum was then trialed following measurement of the resected femoral head initial 48 mm head exam and felt to be too tight and 47 mm trial was carried out which demonstrated excellent fit. A 47 mm head for unipolar implant was chosen. A starter hole was then made through the piriformis fossa. Canal finder was utilized then lateralizing reamer. Reaming was  performed to the size #5. had nice endosteal purchase. Rasping was performed sequentially to the appropriate #5 uncemented rasp. A  Fracture of the upper 1.5cm of the proximal greater trochanter occurred while performing rasping Of the proximal intermedullary canal, this had plenty of cancellous bone and a thick periosteal and muscle tendon attachment and was felf to be of a size that could be stabilized with tension wire fixation. This rasp was then left in place and calcar planing was carried out using a small calcar planer. A reduction was then carried out using +0 was able to be reduced and showed excellent leg with inability to flex well over 120 and internally rotated almost 80 without signs of subluxation. Excellent stability and full extension and external rotation noted. The #5 femoral stem with a +0 neck and 47 mm head was then chosen. The trial components were then removed. The joint was copiously  irrigated with saline solution. Drill  holes were placed in the greater trochanter 3 cm distal to the fractured trochanter and then through the upper aspect of the proximal greater trochanter fragment. 18 Gauge wire was passed through the distal holes from lateral to the intramedullay canal then from medial to the lateral proximal holes and twisted  loosely. The femoral component was then impacted onto the calcar. Wound was again irrigated.   We cleaned the [facility] taper neck and  inserted the final head. This was reduced, and through a full range of motion, it was perfectly stable and there was no subluxation. The greater trochanter fixation wires there then twisted allowing compression across the trochanter fracture Site. These wires were then cut and bent away from the muscles and curved along the proximal trochanter so as to prevent impingement on near muscle tendons or the sciatic nerve. There was no evidence of  instability. Flexion was possible over 115 internal rotation nearly 80 without dislocation. It had a very nice construct.  Wound was then irrigated with saline solution. The capsule was closed  anatomically with #1 Ethibond. The piriformis tendon was then carefully approximated the greater tuberosity and insertion of the gluteus muscles posteriorly. The wound was again irrigated with saline  solution. A medium hemovac drain was placed exiting out the anterolateral right proximal thigh. The iliotibial band was closed with interrupted #1 Ethibond, subcu  was closed with #1 Vicryl and 0 Vicryl, subcutaneous layers were reapproximated with interrupted 0 and 2-0 Vicryl sutures.Skin was closed with stainless steel staples. All instruments and sponge counts were correct Mepilex dressing was applied. A tegaderm applied to the exiting hemovac site.  Knee immobilizer was placed with posterior stays removed.. The patient was then placed in the supine position,  awoken, placed on the operating stretcher, and returned to the  Post anesthesia recovery room in satisfactory condition.       [name]  09/16/2012, 4:41 PM

## 2012-09-16 NOTE — Progress Notes (Signed)
Patient ID: [name], female   DOB: 07/11/1915, 79 y.o.   MRN: [id number] Subjective: Day of Surgery Procedure(s) (LRB): ARTHROPLASTY BIPOLAR HIP (Right)  Patient reports pain as moderate.    Objective:   VITALS:  Temp:  [97.5 F (36.4 C)-98.6 F (37 C)] 98.6 F (37 C) (08/22 0603) Pulse Rate:  [87-104] 104 (08/22 0603) Resp:  [16-20] 18 (08/22 0603) BP: (134-170)/(60-80) 139/80 mmHg (08/22 0603) SpO2:  [93 %-96 %] 94 % (08/22 0603) Weight:  [85.548 kg (188 lb 9.6 oz)] 85.548 kg (188 lb 9.6 oz) (08/22 0603)  Neurologically intact ABD soft Sensation intact distally Intact pulses distally Dorsiflexion/Plantar flexion intact Compartment soft   LABS  Recent Labs  09/14/12 1609 09/15/12 2038  HGB 16.3* 15.4*  WBC 14.7* 12.5*  PLT 248 200    Recent Labs  09/14/12 1609  NA 138  K 4.0  CL 96  CO2 34*  BUN 23  CREATININE 1.63*  GLUCOSE 132*    Recent Labs  09/15/12 2038 09/16/12 0500  INR 1.77* 1.29     Assessment/Plan: Day of Surgery Procedure(s) (LRB): ARTHROPLASTY BIPOLAR HIP (Right)  OR today for right hip hemiarthroplasty. INR 1.29 anticoagulation reversed adequately for hip surgery Patient was seen and examined in the preop holding area. There has been no interval  Change in this patient's exam preop  history and physical exam  Lab tests and images have been examined and reviewed.  The Risks benefits and alternative treatments have been discussed  extensively,questions answered.  The patient has elected to undergo the discussed surgical treatment. NITKA,JAMES E 09/16/2012, 7:46 AM

## 2012-09-16 NOTE — Anesthesia Preprocedure Evaluation (Addendum)
Anesthesia Evaluation  Patient identified by MRN, date of birth, ID band Patient awake    Reviewed: Allergy & Precautions, H&P , NPO status , Patient's Chart, lab work & pertinent test results  Airway Mallampati: III  Neck ROM: Limited    Dental  (+) Edentulous Upper and Edentulous Lower   Pulmonary    Pulmonary exam normal       Cardiovascular Rhythm:Regular Rate:Normal  ------------------------------------------------------------ Study Conclusions ECHO 08-01-12  - Left ventricle: The cavity size was mildly reduced. Wall   thickness was increased in a pattern of mild LVH. Systolic   function was normal. The estimated ejection fraction was   in the range of 60% to 65%. Doppler parameters are   consistent with abnormal left ventricular relaxation   (grade 1 diastolic dysfunction). - Aortic valve: Mild regurgitation. - Mitral valve: Mild regurgitation   Neuro/Psych    GI/Hepatic GERD-  Medicated,  Endo/Other    Renal/GU Renal InsufficiencyRenal disease     Musculoskeletal   Abdominal   Peds  Hematology   Anesthesia Other Findings 09-14-12 X-ray Femur IMPRESSION: Unable to exclude fracture at the junction of the right femoral head/neck; recommend CT imaging of the right hip without contrast or further evaluation.      Reproductive/Obstetrics                         Anesthesia Physical Anesthesia Plan  ASA: III  Anesthesia Plan: General   Post-op Pain Management:    Induction: Intravenous  Airway Management Planned: Oral ETT  Additional Equipment:   Intra-op Plan:   Post-operative Plan:   Informed Consent: I have reviewed the patients History and Physical, chart, labs and discussed the procedure including the risks, benefits and alternatives for the proposed anesthesia with the patient or authorized representative who has indicated his/her understanding and acceptance.   Dental  advisory given  Plan Discussed with: CRNA  Anesthesia Plan Comments:         Anesthesia Quick Evaluation

## 2012-09-16 NOTE — Progress Notes (Signed)
2Orthopedic Tech Progress Note Patient Details:  [name] [birth year] [id number]  Patient ID: [name], female   DOB: Sep 26, 1915, 79 y.o.   MRN: [id number] rn states that pt is unable to use ohf at this time  [name] 09/16/2012, 10:24 PM

## 2012-09-16 NOTE — Progress Notes (Signed)
Pt arrived to pacu with o2 sat of 45%. CRNA and Dr.Joslin beginning to bag patient.

## 2012-09-16 NOTE — Progress Notes (Addendum)
In the post anesthesia care unit patient had decreased O2 saturation. This responded to narcan. She has a history of  Pulmonary embolism 07/2012 and was on continuous O2 preop. Narcotics are unfortunately likely to cause respiratory depression and lower sats. Will need to be treated with minimal narcotics to prevent respiratory decompensation. Need to use Ice packs and tylenol. Encourage deep breathing and coughing and IS.  Dr. Noreene Larsson, anesthesia recommends transfer post PACU to step down unit.

## 2012-09-16 NOTE — Progress Notes (Signed)
ANTICOAGULATION CONSULT NOTE - Initial Consult  Pharmacy Consult for Coumadin Indication: pulmonary embolus  No Known Allergies  Patient Measurements: Height: 5\' 2"  (157.5 cm) Weight: 188 lb 9.6 oz (85.548 kg) (bed scale) IBW/kg (Calculated) : 50.1  Vital Signs: Temp: 97.7 F (36.5 C) (08/22 1600) Temp src: Oral (08/22 1023) BP: 119/54 mmHg (08/22 1730) Pulse Rate: 98 (08/22 1745)  Labs:  Recent Labs  09/14/12 1609 09/15/12 2038 09/16/12 0500 09/16/12 0925 09/16/12 1100  HGB 16.3* 15.4*  --   --  14.8  HCT 49.8* 45.2  --   --  43.5  PLT 248 200  --   --  197  APTT  --   --   --   --  37  LABPROT 34.1* 20.1* 15.8*  --   --   INR 3.54* 1.77* 1.29  --   --   CREATININE 1.63*  --   --  1.56* 1.49*  TROPONINI <0.30  --   --   --   --     Estimated Creatinine Clearance: 22.9 ml/min (by C-G formula based on Cr of 1.49).   Medical History: Past Medical History  Diagnosis Date  . Hyperlipidemia, mild   . GERD (gastroesophageal reflux disease)   . Essential hypertension   . Chest pain   . Pneumonia     Assessment: 79 year old female on Coumadin PTA for PE admitted s/p fall.  Now s/p hip fracture repair and to resume Coumadin  Dose PTA = 5 mg x 2 days then 2.5 mg, repeat  Goal of Therapy:  INR 2-3 Monitor platelets by anticoagulation protocol: Yes   Plan:  1) Coumadin 5 mg po x 1 dose tonight 2) Daily INR  Thank you. Okey Regal, PharmD  09/16/2012,6:33 PM

## 2012-09-16 NOTE — Anesthesia Procedure Notes (Signed)
Procedure Name: Intubation Date/Time: 09/16/2012 1:24 PM Performed by: Tyrone Nine Pre-anesthesia Checklist: Patient identified, Timeout performed, Emergency Drugs available, Suction available and Patient being monitored Patient Re-evaluated:Patient Re-evaluated prior to inductionOxygen Delivery Method: Circle system utilized Preoxygenation: Pre-oxygenation with 100% oxygen Intubation Type: IV induction Ventilation: Mask ventilation without difficulty Laryngoscope Size: Mac and 3 Grade View: Grade I Tube type: Oral Number of attempts: 1 Airway Equipment and Method: Lighted stylet Placement Confirmation: ETT inserted through vocal cords under direct vision,  positive ETCO2 and breath sounds checked- equal and bilateral Secured at: 21 cm Tube secured with: Tape Dental Injury: Teeth and Oropharynx as per pre-operative assessment

## 2012-09-16 NOTE — Progress Notes (Signed)
Patient ID: [name], female   DOB: 28-Apr-1915, 79 y.o.   MRN: [id number] INR is much unproved, scheduled for OR at 1PM Friday.

## 2012-09-16 NOTE — Anesthesia Postprocedure Evaluation (Signed)
  Anesthesia Post-op Note  Patient: [name]  Procedure(s) Performed: Procedure(s): ARTHROPLASTY BIPOLAR HIP (Right)  Patient Location: PACU  Anesthesia Type:General  Level of Consciousness: awake and oriented  Airway and Oxygen Therapy: Patient Spontanous Breathing and Patient connected to face mask oxygen  Post-op Pain: mild  Post-op Assessment: Post-op Vital signs reviewed, Patient's Cardiovascular Status Stable, Patent Airway and Pain level controlled  Post-op Vital Signs: stable  Complications: Post-op hypoxemia see note in chart

## 2012-09-16 NOTE — Progress Notes (Signed)
Narcan administered by Dr.Joslin

## 2012-09-16 NOTE — Progress Notes (Signed)
[facility]  Patient Status: Active (receiving services up to time of hospitalization)  [facility] is providing the following services: RN  If patient discharges after hours, please call [phone].   Jodene Nam 09/16/2012, 2:54 PM

## 2012-09-16 NOTE — Progress Notes (Addendum)
Pt arrived from PACU, VSS, face mask 10L, applied safety mittens b/c Pt was pulling at mask & lines. Will continue to monitor.

## 2012-09-17 DIAGNOSIS — N390 Urinary tract infection, site not specified: Secondary | ICD-10-CM | POA: Diagnosis present

## 2012-09-17 DIAGNOSIS — D649 Anemia, unspecified: Secondary | ICD-10-CM

## 2012-09-17 LAB — URINALYSIS, ROUTINE W REFLEX MICROSCOPIC
Ketones, ur: NEGATIVE mg/dL
Nitrite: NEGATIVE
Protein, ur: NEGATIVE mg/dL

## 2012-09-17 LAB — URINE MICROSCOPIC-ADD ON

## 2012-09-17 LAB — COMPREHENSIVE METABOLIC PANEL
Albumin: 2 g/dL — ABNORMAL LOW (ref 3.5–5.2)
BUN: 18 mg/dL (ref 6–23)
Chloride: 102 mEq/L (ref 96–112)
Creatinine, Ser: 1.33 mg/dL — ABNORMAL HIGH (ref 0.50–1.10)
GFR calc Af Amer: 38 mL/min — ABNORMAL LOW (ref >90)
GFR calc non Af Amer: 33 mL/min — ABNORMAL LOW (ref >90)
Total Bilirubin: 0.7 mg/dL (ref 0.3–1.2)

## 2012-09-17 LAB — CBC
HCT: 38.3 % (ref 36.0–46.0)
Hemoglobin: 12.8 g/dL (ref 12.0–15.0)
MCHC: 33.4 g/dL (ref 30.0–36.0)
MCV: 91.2 fL (ref 78.0–100.0)
WBC: 14.2 10*3/uL — ABNORMAL HIGH (ref 4.0–10.5)

## 2012-09-17 LAB — GLUCOSE, CAPILLARY: Glucose-Capillary: 106 mg/dL — ABNORMAL HIGH (ref 70–99)

## 2012-09-17 LAB — PROTIME-INR: INR: 1.24 (ref 0.00–1.49)

## 2012-09-17 MED ORDER — WHITE PETROLATUM GEL
Status: AC
  Administered 2012-09-17: 20:00:00
  Filled 2012-09-17: qty 5

## 2012-09-17 MED ORDER — BUMETANIDE 1 MG PO TABS
1.0000 mg | ORAL_TABLET | Freq: Every day | ORAL | Status: DC
  Filled 2012-09-17 (×2): qty 1

## 2012-09-17 MED ORDER — WARFARIN SODIUM 5 MG PO TABS
5.0000 mg | ORAL_TABLET | Freq: Once | ORAL | Status: AC
  Administered 2012-09-17: 5 mg via ORAL
  Filled 2012-09-17: qty 1

## 2012-09-17 MED ORDER — VANCOMYCIN HCL IN DEXTROSE 750-5 MG/150ML-% IV SOLN
750.0000 mg | INTRAVENOUS | Status: DC
  Administered 2012-09-17 – 2012-09-21 (×5): 750 mg via INTRAVENOUS
  Filled 2012-09-17 (×5): qty 150

## 2012-09-17 NOTE — Progress Notes (Signed)
ANTICOAGULATION CONSULT NOTE - Follow Up Consult  Pharmacy Consult for Coumadin Indication: recent PE  Patient Measurements: Height: 5\' 2"  (157.5 cm) Weight: 188 lb 9.6 oz (85.548 kg) (bed scale) IBW/kg (Calculated) : 50.1 Vital Signs: Temp: 100.8 F (38.2 C) (08/23 0400) Temp src: Oral (08/22 2300) BP: 137/66 mmHg (08/23 0400) Pulse Rate: 99 (08/23 0400) Labs:  Recent Labs  09/14/12 1609 09/15/12 2038 09/16/12 0500 09/16/12 0925 09/16/12 1100 09/17/12 0600  HGB 16.3* 15.4*  --   --  14.8 12.8  HCT 49.8* 45.2  --   --  43.5 38.3  PLT 248 200  --   --  197 183  APTT  --   --   --   --  37  --   LABPROT 34.1* 20.1* 15.8*  --   --  15.3*  INR 3.54* 1.77* 1.29  --   --  1.24  CREATININE 1.63*  --   --  1.56* 1.49* 1.33*  TROPONINI <0.30  --   --   --   --   --    Estimated Creatinine Clearance: 25.7 ml/min (by C-G formula based on Cr of 1.33).  Assessment: 89 YOF on Coumadin for recent PE and s/p THA. INR today is 1.24 (decreased and sub-therapeutic) after 1 dose of 5mg  overnight. This is likely still effects of the vitamin K received on 8/21. H/H/platelets slowly trending down since admission. No bleeding documented. Note patient with CKD (INR improving) and clear liquid diet resumed.   Goal of Therapy:  INR 2-3   Plan:  1. Coumadin 5mg  po x1 tonight. 2. Follow-up PT/INR in AM.  3. Follow-up CBC.  4. D/c sq heparin when INR >2.   Link Snuffer, PharmD, BCPS Clinical Pharmacist [phone] 09/17/2012,8:45 AM

## 2012-09-17 NOTE — Progress Notes (Signed)
[facility] PROGRESS NOTE  [name] ZOX:[id number] DOB: Feb 03, 1915 DOA: 09/14/2012 PCP: Milana Obey, MD  HPI/Subjective: Feeling much better, getting oxygen through face tent. Had low-grade fever of 100.8.  Assessment/Plan:  Right hip fracture -Status post unipolar hemiarthroplasty. -Patient tolerating pain very well. PT/OT per orthopedics. -Patient probably will need skilled nursing facility.  Acute respiratory failure -With hypoxia, unclear etiology, but patient has chronic CHF (LVEF 63%/grade 1 DD) and recent history of PE.  -Oxygen through facial tent. -IV fluids discontinued, resume Bumex. Chest x-ray did not show any evidence of pneumonia. -Wean off the oxygen as tolerated.  Low-grade fever -Urine cultures growing enterococcus and staph aureus. -I repeated blood culture and urine culture. Pharmacy to start vancomycin.  Chronic atrial fibrillation -Currently on sinus rhythm, patient is on anticoagulation we will resume as it's okay with orthopedics.  History of PE   -Patient is on Coumadin, pharmacy asked to restart it.  CKD stage III  -Creatinine at baseline, baseline creatinine is 1.44, creatinine today is 1.33.  Code Status: DO NOT RESUSCITATE Family Communication: Plan discussed with the patient Disposition Plan: Remains inpatient   Consultants:  Orthopedics  Procedures:  Right hip hemiarthroplasty done on 09/16/2012 by Dr. Otelia Sergeant  Antibiotics:  None  Objective: Filed Vitals:   09/17/12 0835  BP:   Pulse: 102  Temp:   Resp: 21    Intake/Output Summary (Last 24 hours) at 09/17/12 1020 Last data filed at 09/17/12 0900  Gross per 24 hour  Intake   3350 ml  Output    700 ml  Net   2650 ml   Filed Weights   09/14/12 2243 09/15/12 0555 09/16/12 0603  Weight: 86.1 kg (189 lb 13.1 oz) 86.2 kg (190 lb 0.6 oz) 85.548 kg (188 lb 9.6 oz)    Exam: General: Alert and awake, oriented x3, not in any acute distress. HEENT:  anicteric sclera, pupils reactive to light and accommodation, EOMI CVS: S1-S2 clear, no murmur rubs or gallops Chest: clear to auscultation bilaterally, no wheezing, rales or rhonchi Abdomen: soft nontender, nondistended, normal bowel sounds, no organomegaly Extremities: no cyanosis, clubbing or edema noted bilaterally Neuro: Cranial nerves II-XII intact, no focal neurological deficits  Data Reviewed: Basic Metabolic Panel:  Recent Labs Lab 09/14/12 1609 09/16/12 0925 09/16/12 1100 09/17/12 0600  NA 138 138 136 136  K 4.0 3.9 4.0 4.4  CL 96 100 100 102  CO2 34* 28 27 25   GLUCOSE 132* 120* 108* 109*  BUN 23 21 21 18   CREATININE 1.63* 1.56* 1.49* 1.33*  CALCIUM 9.3 8.4 8.4 8.4   Liver Function Tests:  Recent Labs Lab 09/16/12 0925 09/17/12 0600  AST 18 21  ALT 12 9  ALKPHOS 61 51  BILITOT 1.0 0.7  PROT 6.0 5.3*  ALBUMIN 2.6* 2.0*   No results found for this basename: LIPASE, AMYLASE,  in the last 168 hours No results found for this basename: AMMONIA,  in the last 168 hours CBC:  Recent Labs Lab 09/14/12 1609 09/15/12 2038 09/16/12 1100 09/17/12 0600  WBC 14.7* 12.5* 13.6* 14.2*  HGB 16.3* 15.4* 14.8 12.8  HCT 49.8* 45.2 43.5 38.3  MCV 92.7 91.5 90.8 91.2  PLT 248 200 197 183   Cardiac Enzymes:  Recent Labs Lab 09/14/12 1609  TROPONINI <0.30   BNP (last 3 results)  Recent Labs  03/19/12 1538 07/29/12 1421  PROBNP 445.4 427.6   CBG:  Recent Labs Lab 09/16/12 2140  GLUCAP 106*    Recent Results (  from the past 240 hour(s))  URINE CULTURE     Status: None   Collection Time    09/15/12  2:17 PM      Result Value Range Status   Specimen Description URINE, CATHETERIZED   Final   Special Requests Normal   Final   Culture  Setup Time     Final   Value: 09/15/2012 15:41     Performed at [facility] Count     Final   Value: >=100,000 COLONIES/ML     Performed at [facility]   Culture     Final   Value:  ENTEROCOCCUS SPECIES     STAPHYLOCOCCUS AUREUS     Note: RIFAMPIN AND GENTAMICIN SHOULD NOT BE USED AS SINGLE DRUGS FOR TREATMENT OF STAPH INFECTIONS.     Performed at [facility]   Report Status PENDING   Incomplete  CULTURE, BLOOD (ROUTINE X 2)     Status: None   Collection Time    09/15/12  2:59 PM      Result Value Range Status   Specimen Description BLOOD LEFT HAND   Final   Special Requests     Final   Value: BOTTLES DRAWN AEROBIC AND ANAEROBIC BLUE 10CC RED 6CC   Culture  Setup Time     Final   Value: 09/15/2012 22:27     Performed at [facility]   Culture     Final   Value:        BLOOD CULTURE RECEIVED NO GROWTH TO DATE CULTURE WILL BE HELD FOR 5 DAYS BEFORE ISSUING A FINAL NEGATIVE REPORT     Performed at [facility]   Report Status PENDING   Incomplete  CULTURE, BLOOD (ROUTINE X 2)     Status: None   Collection Time    09/15/12  3:32 PM      Result Value Range Status   Specimen Description BLOOD RIGHT ARM   Final   Special Requests BOTTLES DRAWN AEROBIC AND ANAEROBIC 10CC   Final   Culture  Setup Time     Final   Value: 09/15/2012 22:27     Performed at [facility]   Culture     Final   Value:        BLOOD CULTURE RECEIVED NO GROWTH TO DATE CULTURE WILL BE HELD FOR 5 DAYS BEFORE ISSUING A FINAL NEGATIVE REPORT     Performed at [facility]   Report Status PENDING   Incomplete  SURGICAL PCR SCREEN     Status: Abnormal   Collection Time    09/16/12  7:51 AM      Result Value Range Status   MRSA, PCR POSITIVE (*) NEGATIVE Final   Staphylococcus aureus POSITIVE (*) NEGATIVE Final   Comment:            The Xpert SA Assay (FDA     approved for NASAL specimens     in patients over 69 years of age),     is one component of     a comprehensive surveillance     program.  Test performance has     been validated by [employer] for patients greater     than or equal to 32 year old.     It is not intended     to diagnose  infection nor to     guide or monitor treatment.     Studies:  Dg Pelvis Portable  09/16/2012   *RADIOLOGY REPORT*  Clinical Data: Postop right hip  PORTABLE PELVIS  Comparison: 09/14/2012  Findings: The patient is status post operative repair of the right femoral neck fracture. There is right hip prosthesis  in anatomic alignment.  Postsurgical changes are noted with the lateral skin staples.  A surgical drain in place is noted.  IMPRESSION: Right hip prosthesis in anatomic alignment.  Postsurgical changes are noted.   Original Report Authenticated By: Natasha Mead, M.D.   Dg Chest Port 1 View  09/16/2012   *RADIOLOGY REPORT*  Clinical Data: Postoperative hypoxemia following hip surgery, hard 10 waking up from anesthesia, apneic episode  PORTABLE CHEST - 1 VIEW  Comparison: Portable exam 1626 hours compared to 09/14/2012 Correlation:  CT chest 07/29/2012  Findings: Enlargement of cardiac silhouette with pulmonary vascular congestion. Atherosclerotic calcification aorta. Perihilar infiltrates question edema and CHF. Bowel identified at the inferior left hemithorax corresponding to a markedly elevated left hemi diaphragm and large hiatal hernia as better visualized on recent CT. No gross pleural effusion or pneumothorax. Bones demineralized.  IMPRESSION: Enlargement of cardiac silhouette with pulmonary vascular congestion and perihilar infiltrates question pulmonary edema/CHF. Markedly elevated left diaphragm as well as a large hiatal hernia.   Original Report Authenticated By: Ulyses Southward, M.D.    Scheduled Meds: . amLODipine  10 mg Oral Daily  . bumetanide  1 mg Oral QODAY  . calcium-vitamin D  1 tablet Oral BID  . Chlorhexidine Gluconate Cloth  6 each Topical Q0600  . docusate sodium  100 mg Oral BID  . ferrous sulfate  325 mg Oral BID  . heparin subcutaneous  5,000 Units Subcutaneous Q8H  . loratadine  10 mg Oral Daily  . multivitamin with minerals  1 tablet Oral Daily  . mupirocin ointment  1  application Nasal BID  . pantoprazole  40 mg Oral Daily  . warfarin  5 mg Oral ONCE-1800  . warfarin  5 mg Oral ONCE-1800  . Warfarin - Pharmacist Dosing Inpatient   Does not apply q1800   Continuous Infusions: . sodium chloride 100 mL/hr at 09/17/12 0603    Principal Problem:   Hip fracture, right Active Problems:   HYPERLIPIDEMIA, MILD   Unspecified essential hypertension   HIATAL HERNIA   Pulmonary emboli   CKD (chronic kidney disease) stage 3, GFR 30-59 ml/min   Leukocytosis, unspecified   Chronic atrial fibrillation    Time spent: 35 minutes    [facility] A  [facility] Pager [phone]. If 7PM-7AM, please contact night-coverage at [email or web address], password [facility] 09/17/2012, 10:20 AM  LOS: 3 days

## 2012-09-17 NOTE — Progress Notes (Signed)
PT Cancellation Note  Patient Details Name: [name] MRN: [id number] DOB: Oct 06, 1915   Cancelled Treatment:    Reason Eval/Treat Not Completed: Medical issues which prohibited therapy.  Per RN, patient having difficulty maintaining O2 sats with face mask O2 in bed. Will hold PT today and return tomorrow for PT evaluation if appropriate.   Vena Austria 09/17/2012, 3:15 PM Durenda Hurt. Renaldo Fiddler, [facility] Pager [phone]

## 2012-09-17 NOTE — Progress Notes (Signed)
ANTIBIOTIC CONSULT NOTE - INITIAL  Pharmacy Consult for vancomycin Indication: UTI (enterococcus, st aureus)  No Known Allergies Patient Measurements: Height: 5\' 2"  (157.5 cm) Weight: 188 lb 9.6 oz (85.548 kg) (bed scale) IBW/kg (Calculated) : 50.1 Vital Signs: Temp: 100.8 F (38.2 C) (08/23 0400) Temp src: Oral (08/22 2300) BP: 141/60 mmHg (08/23 0820) Pulse Rate: 102 (08/23 0835) Intake/Output from previous day: 08/22 0701 - 08/23 0700 In: 3150 [I.V.:3150] Out: 700 [Urine:450; Blood:250] Intake/Output from this shift: Total I/O In: 200 [I.V.:200] Out: -  Labs:  Recent Labs  09/15/12 2038 09/16/12 0925 09/16/12 1100 09/17/12 0600  WBC 12.5*  --  13.6* 14.2*  HGB 15.4*  --  14.8 12.8  PLT 200  --  197 183  CREATININE  --  1.56* 1.49* 1.33*   Estimated Creatinine Clearance: 25.7 ml/min (by C-G formula based on Cr of 1.33).  Microbiology: 8/21 Urine >> 100k Enterococcus, St. Aureus (sensitivities pending) 8/23 Blood >> 8/22 Blood >> ngtd 8/22 MRSA PCR positive  Medical History: Past Medical History  Diagnosis Date  . Hyperlipidemia, mild   . GERD (gastroesophageal reflux disease)   . Essential hypertension   . Chest pain   . Pneumonia    Assessment: [name] with low-grade fever and leukocytosis and urine culture growing >100K enterococcus and staph aureus to start IV vancomycin empirically. Sensitivities are still pending. SCr elevated at 1.33 but trending down. Current CrCl~57ml/min.   Goal of Therapy:  Vancomycin trough level 10-15 mcg/ml  Plan:  1. Vancomycin 750mg  IV q24h.  2. Follow-up renal function and adjust dose as needed. 3. Follow-up culture sensitivities and ability to narrow therapy. 4. VT at [facility] if remains on therapy.   Link Snuffer, PharmD, BCPS Clinical Pharmacist [phone] 09/17/2012,10:40 AM

## 2012-09-17 NOTE — Progress Notes (Signed)
Clinical Social Work Department BRIEF PSYCHOSOCIAL ASSESSMENT 09/17/2012  Patient:  [name], [name]     Account Number:  [id number]     Admit date:  09/14/2012  Clinical Social Worker:  Hadley Pen  Date/Time:  09/17/2012 03:28 PM  Referred by:  Physician  Date Referred:  09/16/2012 Referred for  SNF Placement   Other Referral:   Interview type:  Patient Other interview type:    PSYCHOSOCIAL DATA Living Status:  ALONE Admitted from facility:   Level of care:   Primary support name:  Jersey Espinoza, [phone] [facility]   [phone] C Primary support relationship to patient:  CHILD, ADULT Degree of support available:   unknown    CURRENT CONCERNS Current Concerns  Post-Acute Placement   Other Concerns:    SOCIAL WORK ASSESSMENT / PLAN Weekend CSW spoke with patient about plans after discharge. CSW discussed the probable recommendation of rehab and the benefits of short term rehab with patient. Patient agreed for CSW to fax out for bed offers in [facility]. but would prefer somewhere near her home in [place]. Weekday CSW to follow.   Assessment/plan status:  Information/Referral to [facility] Other assessment/ plan:   Information/referral to community resources:   Weekend CSW provided patient with list of SNF facilities in [facility] [place].    PATIENT'S/FAMILY'S RESPONSE TO PLAN OF CARE: Patient was agreeable to rehab.    Samuella Bruin, MSW, LCSWA Clinical Social Worker [facility] Emergency Dept. [phone]

## 2012-09-17 NOTE — Progress Notes (Signed)
Clinical Social Work Department CLINICAL SOCIAL WORK PLACEMENT NOTE 09/17/2012  Patient:  [name], [name]  Account Number:  [id number] Admit date:  09/14/2012  Clinical Social Worker:  Samuella Bruin, Theresia Majors  Date/time:  09/17/2012 03:36 PM  Clinical Social Work is seeking post-discharge placement for this patient at the following level of care:   SKILLED NURSING   (*CSW will update this form in Epic as items are completed)   09/17/2012  Patient/family provided with [facility] Department of Clinical Social Work's list of facilities offering this level of care within the geographic area requested by the patient (or if unable, by the patient's family).  09/17/2012  Patient/family informed of their freedom to choose among providers that offer the needed level of care, that participate in Medicare, Medicaid or managed care program needed by the patient, have an available bed and are willing to accept the patient.  09/17/2012  Patient/family informed of [facility]' ownership interest in [facility], as well as of the fact that they are under no obligation to receive care at this facility.  PASARR submitted to EDS on 09/16/2012 PASARR number received from EDS on 09/16/2012  FL2 transmitted to all facilities in geographic area requested by pt/family on  09/17/2012 FL2 transmitted to all facilities within larger geographic area on 09/17/2012  Patient informed that his/her managed care company has contracts with or will negotiate with  certain facilities, including the following:     Patient/family informed of bed offers received:   Patient chooses bed at  Physician recommends and patient chooses bed at    Patient to be transferred to  on   Patient to be transferred to facility by   The following physician request were entered in Epic:   Additional Comments:   Samuella Bruin, MSW, LCSWA Clinical Social Worker [facility] Emergency Dept. [phone]

## 2012-09-17 NOTE — Progress Notes (Signed)
Patient ID: [name], female   DOB: 09/20/1915, 79 y.o.   MRN: [id number] Continue foley, O2 sats improving, drain removed, dressing dry and intact, continue care in step down unit.

## 2012-09-18 LAB — COMPREHENSIVE METABOLIC PANEL
AST: 18 U/L (ref 0–37)
Albumin: 1.9 g/dL — ABNORMAL LOW (ref 3.5–5.2)
Calcium: 8.5 mg/dL (ref 8.4–10.5)
Chloride: 101 mEq/L (ref 96–112)
Creatinine, Ser: 1.29 mg/dL — ABNORMAL HIGH (ref 0.50–1.10)
Total Bilirubin: 0.7 mg/dL (ref 0.3–1.2)
Total Protein: 5.4 g/dL — ABNORMAL LOW (ref 6.0–8.3)

## 2012-09-18 LAB — CBC
HCT: 36.4 % (ref 36.0–46.0)
Hemoglobin: 12 g/dL (ref 12.0–15.0)
MCH: 30.1 pg (ref 26.0–34.0)
MCV: 91.2 fL (ref 78.0–100.0)
RBC: 3.99 MIL/uL (ref 3.87–5.11)
WBC: 14.7 10*3/uL — ABNORMAL HIGH (ref 4.0–10.5)

## 2012-09-18 LAB — URINE CULTURE: Colony Count: >=100000

## 2012-09-18 MED ORDER — WARFARIN SODIUM 7.5 MG PO TABS
7.5000 mg | ORAL_TABLET | Freq: Once | ORAL | Status: AC
  Administered 2012-09-18: 7.5 mg via ORAL
  Filled 2012-09-18: qty 1

## 2012-09-18 MED ORDER — ENSURE COMPLETE PO LIQD
237.0000 mL | Freq: Two times a day (BID) | ORAL | Status: DC
  Administered 2012-09-18 – 2012-09-21 (×6): 237 mL via ORAL

## 2012-09-18 MED ORDER — TRAMADOL HCL 50 MG PO TABS
25.0000 mg | ORAL_TABLET | Freq: Four times a day (QID) | ORAL | Status: DC | PRN
  Administered 2012-09-18 – 2012-09-21 (×4): 25 mg via ORAL
  Filled 2012-09-18 (×5): qty 1

## 2012-09-18 MED ORDER — TRAMADOL HCL 50 MG PO TABS: 50.0000 mg | ORAL_TABLET | Freq: Four times a day (QID) | ORAL | Status: DC

## 2012-09-18 MED ORDER — FUROSEMIDE 10 MG/ML IJ SOLN
40.0000 mg | Freq: Three times a day (TID) | INTRAMUSCULAR | Status: DC
  Administered 2012-09-18 – 2012-09-20 (×7): 40 mg via INTRAVENOUS
  Filled 2012-09-18 (×10): qty 4

## 2012-09-18 NOTE — Progress Notes (Signed)
[facility] PROGRESS NOTE  [name]:[id number] DOB: Jun 04, 1945 DOA: 09/14/2012 PCP: Milana Obey, MD  HPI/Subjective: Oxygen weaned off to nonrebreather mask from face tent. Continue to wean off. Stopped Bumex and started Lasix.  Assessment/Plan:  Right hip fracture -Status post unipolar hemiarthroplasty. -Patient tolerating pain very well. PT/OT per orthopedics. -Patient probably will need skilled nursing facility.  Acute respiratory failure -With hypoxia, unclear etiology, but patient has chronic CHF (LVEF 63%/grade 1 DD) and recent history of PE.  -Oxygen through facial tent, this to wean 200 but her mouth. -IV fluids discontinued, resume Bumex. Chest x-ray did not show any evidence of pneumonia. -Wean off the oxygen as tolerated. -Diuresis increased, follow intake and output.  UTI -Urine cultures growing enterococcus and staph aureus. -I repeated blood culture and urine culture. Pharmacy to start vancomycin.  Chronic atrial fibrillation -Currently on sinus rhythm, patient is on anticoagulation we will resume as it's okay with orthopedics.  History of PE   -Patient is on Coumadin, pharmacy asked to restart it.  CKD stage III  -Creatinine at baseline, baseline creatinine is 1.44, creatinine today is 1.29.  Code Status: DO NOT RESUSCITATE Family Communication: Plan discussed with the patient Disposition Plan: Remains inpatient   Consultants:  Orthopedics  Procedures:  Right hip hemiarthroplasty done on 09/16/2012 by Dr. Otelia Sergeant  Antibiotics:  None  Objective: Filed Vitals:   09/18/12 0720  BP: 140/60  Pulse: 86  Temp:   Resp: 18    Intake/Output Summary (Last 24 hours) at 09/18/12 1002 Last data filed at 09/18/12 0700  Gross per 24 hour  Intake    570 ml  Output    850 ml  Net   -280 ml   Filed Weights   09/14/12 2243 09/15/12 0555 09/16/12 0603  Weight: 86.1 kg (189 lb 13.1 oz) 86.2 kg (190 lb 0.6 oz) 85.548 kg (188 lb 9.6  oz)    Exam: General: Alert and awake, oriented x3, not in any acute distress. HEENT: anicteric sclera, pupils reactive to light and accommodation, EOMI CVS: S1-S2 clear, no murmur rubs or gallops Chest: clear to auscultation bilaterally, no wheezing, rales or rhonchi Abdomen: soft nontender, nondistended, normal bowel sounds, no organomegaly Extremities: no cyanosis, clubbing or edema noted bilaterally Neuro: Cranial nerves II-XII intact, no focal neurological deficits  Data Reviewed: Basic Metabolic Panel:  Recent Labs Lab 09/14/12 1609 09/16/12 0925 09/16/12 1100 09/17/12 0600 09/18/12 0530  NA 138 138 136 136 135  K 4.0 3.9 4.0 4.4 3.9  CL 96 100 100 102 101  CO2 34* 28 27 25 28   GLUCOSE 132* 120* 108* 109* 101*  BUN 23 21 21 18 21   CREATININE 1.63* 1.56* 1.49* 1.33* 1.29*  CALCIUM 9.3 8.4 8.4 8.4 8.5   Liver Function Tests:  Recent Labs Lab 09/16/12 0925 09/17/12 0600 09/18/12 0530  AST 18 21 18   ALT 12 9 6   ALKPHOS 61 51 58  BILITOT 1.0 0.7 0.7  PROT 6.0 5.3* 5.4*  ALBUMIN 2.6* 2.0* 1.9*   No results found for this basename: LIPASE, AMYLASE,  in the last 168 hours No results found for this basename: AMMONIA,  in the last 168 hours CBC:  Recent Labs Lab 09/14/12 1609 09/15/12 2038 09/16/12 1100 09/17/12 0600 09/18/12 0530  WBC 14.7* 12.5* 13.6* 14.2* 14.7*  HGB 16.3* 15.4* 14.8 12.8 12.0  HCT 49.8* 45.2 43.5 38.3 36.4  MCV 92.7 91.5 90.8 91.2 91.2  PLT 248 200 197 183 203   Cardiac Enzymes:  Recent Labs Lab 09/14/12 1609  TROPONINI <0.30   BNP (last 3 results)  Recent Labs  03/19/12 1538 07/29/12 1421  PROBNP 445.4 427.6   CBG:  Recent Labs Lab 09/16/12 2140  GLUCAP 106*    Recent Results (from the past 240 hour(s))  URINE CULTURE     Status: None   Collection Time    09/15/12  2:17 PM      Result Value Range Status   Specimen Description URINE, CATHETERIZED   Final   Special Requests Normal   Final   Culture  Setup Time      Final   Value: 09/15/2012 15:41     Performed at [facility] Count     Final   Value: >=100,000 COLONIES/ML     Performed at [facility]   Culture     Final   Value: ENTEROCOCCUS SPECIES     STAPHYLOCOCCUS AUREUS     Note: RIFAMPIN AND GENTAMICIN SHOULD NOT BE USED AS SINGLE DRUGS FOR TREATMENT OF STAPH INFECTIONS.     Performed at [facility]   Report Status PENDING   Incomplete  CULTURE, BLOOD (ROUTINE X 2)     Status: None   Collection Time    09/15/12  2:59 PM      Result Value Range Status   Specimen Description BLOOD LEFT HAND   Final   Special Requests     Final   Value: BOTTLES DRAWN AEROBIC AND ANAEROBIC BLUE 10CC RED 6CC   Culture  Setup Time     Final   Value: 09/15/2012 22:27     Performed at [facility]   Culture     Final   Value:        BLOOD CULTURE RECEIVED NO GROWTH TO DATE CULTURE WILL BE HELD FOR 5 DAYS BEFORE ISSUING A FINAL NEGATIVE REPORT     Performed at [facility]   Report Status PENDING   Incomplete  CULTURE, BLOOD (ROUTINE X 2)     Status: None   Collection Time    09/15/12  3:32 PM      Result Value Range Status   Specimen Description BLOOD RIGHT ARM   Final   Special Requests BOTTLES DRAWN AEROBIC AND ANAEROBIC 10CC   Final   Culture  Setup Time     Final   Value: 09/15/2012 22:27     Performed at [facility]   Culture     Final   Value:        BLOOD CULTURE RECEIVED NO GROWTH TO DATE CULTURE WILL BE HELD FOR 5 DAYS BEFORE ISSUING A FINAL NEGATIVE REPORT     Performed at [facility]   Report Status PENDING   Incomplete  SURGICAL PCR SCREEN     Status: Abnormal   Collection Time    09/16/12  7:51 AM      Result Value Range Status   MRSA, PCR POSITIVE (*) NEGATIVE Final   Staphylococcus aureus POSITIVE (*) NEGATIVE Final   Comment:            The Xpert SA Assay (FDA     approved for NASAL specimens     in patients over 3 years of age),     is one component of      a comprehensive surveillance     program.  Test performance has     been validated by [employer] for  patients greater     than or equal to 43 year old.     It is not intended     to diagnose infection nor to     guide or monitor treatment.     Studies: Dg Pelvis Portable  09/16/2012   *RADIOLOGY REPORT*  Clinical Data: Postop right hip  PORTABLE PELVIS  Comparison: 09/14/2012  Findings: The patient is status post operative repair of the right femoral neck fracture. There is right hip prosthesis  in anatomic alignment.  Postsurgical changes are noted with the lateral skin staples.  A surgical drain in place is noted.  IMPRESSION: Right hip prosthesis in anatomic alignment.  Postsurgical changes are noted.   Original Report Authenticated By: Natasha Mead, M.D.   Dg Chest Port 1 View  09/16/2012   *RADIOLOGY REPORT*  Clinical Data: Postoperative hypoxemia following hip surgery, hard 10 waking up from anesthesia, apneic episode  PORTABLE CHEST - 1 VIEW  Comparison: Portable exam 1626 hours compared to 09/14/2012 Correlation:  CT chest 07/29/2012  Findings: Enlargement of cardiac silhouette with pulmonary vascular congestion. Atherosclerotic calcification aorta. Perihilar infiltrates question edema and CHF. Bowel identified at the inferior left hemithorax corresponding to a markedly elevated left hemi diaphragm and large hiatal hernia as better visualized on recent CT. No gross pleural effusion or pneumothorax. Bones demineralized.  IMPRESSION: Enlargement of cardiac silhouette with pulmonary vascular congestion and perihilar infiltrates question pulmonary edema/CHF. Markedly elevated left diaphragm as well as a large hiatal hernia.   Original Report Authenticated By: Ulyses Southward, M.D.    Scheduled Meds: . amLODipine  10 mg Oral Daily  . calcium-vitamin D  1 tablet Oral BID  . Chlorhexidine Gluconate Cloth  6 each Topical Q0600  . docusate sodium  100 mg Oral BID  . ferrous sulfate  325 mg Oral  BID  . furosemide  40 mg Intravenous Q8H  . heparin subcutaneous  5,000 Units Subcutaneous Q8H  . loratadine  10 mg Oral Daily  . multivitamin with minerals  1 tablet Oral Daily  . mupirocin ointment  1 application Nasal BID  . pantoprazole  40 mg Oral Daily  . vancomycin  750 mg Intravenous Q24H  . warfarin  7.5 mg Oral ONCE-1800  . Warfarin - Pharmacist Dosing Inpatient   Does not apply q1800   Continuous Infusions:    Principal Problem:   Hip fracture, right Active Problems:   Unspecified essential hypertension   Pulmonary emboli   CKD (chronic kidney disease) stage 3, GFR 30-59 ml/min   Leukocytosis, unspecified   Chronic atrial fibrillation   UTI (urinary tract infection)    Time spent: 35 minutes    [facility] A  [facility] Pager [phone]. If 7PM-7AM, please contact night-coverage at [email or web address], password [facility] 09/18/2012, 10:02 AM  LOS: 4 days

## 2012-09-18 NOTE — Evaluation (Signed)
Physical Therapy Evaluation Patient Details Name: [name] MRN: [id number] DOB: 09-24-1915 Today's Date: 09/18/2012 Time: 4098-1191 PT Time Calculation (min): 11 min  PT Assessment / Plan / Recommendation History of Present Illness  Patient is a 79 yo female s/p Rt hip hemiarthroplasty due to hip fracture from fall.  Patient with acute resp failure - now weened to Venturi mask.  Clinical Impression  Patient presents with problems listed below.  Session today limited by respiratory status.  Will benefit from acute PT to maximize independence prior to discharge.  Recommend SNF at discharge for continued therapy.    PT Assessment  Patient needs continued PT services    Follow Up Recommendations  SNF    Does the patient have the potential to tolerate intense rehabilitation      Barriers to Discharge Decreased caregiver support Lives alone    Equipment Recommendations  None recommended by PT    Recommendations for Other Services     Frequency Min 5X/week    Precautions / Restrictions Precautions Precautions: Posterior Hip;Fall Precaution Booklet Issued: No Precaution Comments: Reviewed precautions with RN Restrictions Weight Bearing Restrictions: Yes RLE Weight Bearing: Weight bearing as tolerated   Pertinent Vitals/Pain Pain limiting mobility. O2 sat dropped to 86% with minimal activity limiting session.      Mobility  Bed Mobility Bed Mobility: Rolling Right Rolling Right: 1: +2 Total assist;With rail Rolling Right: Patient Percentage: 10% Details for Bed Mobility Assistance: Verbal and tactile cues for technique.  Assisted patient to reach for rail - unable to hold onto rail to assist with rolling.  Assisted patient to flex LLE to assist with rolling - required mod assist to position LLE.  Used bed pad to assist patient with rolling.  Patient with decrease in O2 sats to 86% with minimal activity.  Ended PT session and made RN aware. Transfers Transfers: Not  assessed    Exercises Total Joint Exercises Ankle Circles/Pumps: AROM;Both;10 reps;Supine   PT Diagnosis: Difficulty walking;Generalized weakness;Acute pain  PT Problem List: Decreased strength;Decreased range of motion;Decreased activity tolerance;Decreased balance;Decreased mobility;Decreased knowledge of use of DME;Decreased knowledge of precautions;Cardiopulmonary status limiting activity;Pain PT Treatment Interventions: DME instruction;Gait training;Functional mobility training;Therapeutic exercise;Patient/family education     PT Goals(Current goals can be found in the care plan section) Acute Rehab PT Goals [name] PT Goal Formulation: With patient Time For Goal Achievement: 10/02/12 Potential to Achieve Goals: Good  Visit Information  Last PT Received On: 09/18/12 Assistance Needed: +2 History of Present Illness: Patient is a 79 yo female s/p Rt hip hemiarthroplasty due to hip fracture from fall.  Patient with acute resp failure - now weened to Venturi mask.       Prior Functioning  Home Living Family/patient expects to be discharged to:: Skilled nursing facility Living Arrangements: Alone Home Equipment: Walker - 4 wheels Prior Function Level of Independence: Independent with assistive device(s) Communication Communication: No difficulties    Cognition  Cognition Arousal/Alertness: Awake/alert Behavior During Therapy: Anxious Overall Cognitive Status: Within Functional Limits for tasks assessed    Extremity/Trunk Assessment Upper Extremity Assessment Upper Extremity Assessment: Generalized weakness Lower Extremity Assessment Lower Extremity Assessment: RLE deficits/detail;LLE deficits/detail RLE Deficits / Details: Unable to move RLE against gravity. RLE: Unable to fully assess due to pain LLE Deficits / Details: General weakness - strength 3/5   Balance    End of Session PT - End of Session Activity Tolerance: Patient limited by  pain;Treatment limited secondary to medical complications (Comment) (Limited  by respiratory status) Patient left: in bed;with call bell/phone within reach;with bed alarm set Nurse Communication: Mobility status (Decrease in O2 sat with activity)  GP     Vena Austria 09/18/2012, 4:15 PM  Durenda Hurt. Renaldo Fiddler, [facility] Acute Rehab Services Pager [phone]

## 2012-09-18 NOTE — Progress Notes (Signed)
ANTICOAGULATION CONSULT NOTE - Follow Up Consult  Pharmacy Consult for Coumadin Indication: recent PE, THA  Patient Measurements: Height: 5\' 2"  (157.5 cm) Weight: 188 lb 9.6 oz (85.548 kg) (bed scale) IBW/kg (Calculated) : 50.1 Vital Signs: Temp: 98.6 F (37 C) (08/24 0700) Temp src: Oral (08/24 0700) BP: 140/60 mmHg (08/24 0720) Pulse Rate: 86 (08/24 0720) Labs:  Recent Labs  09/16/12 0500  09/16/12 0925 09/16/12 1100 09/17/12 0600 09/18/12 0530  HGB  --   --   --  14.8 12.8 12.0  HCT  --   --   --  43.5 38.3 36.4  PLT  --   --   --  197 183 203  APTT  --   --   --  37  --   --   LABPROT 15.8*  --   --   --  15.3* 15.1  INR 1.29  --   --   --  1.24 1.22  CREATININE  --   < > 1.56* 1.49* 1.33* 1.29*  < > = values in this interval not displayed.  Estimated Creatinine Clearance: 26.5 ml/min (by C-G formula based on Cr of 1.29).  Assessment: 89 YOF on warfarin for recent PE and THA. INR today is 1.22 (decreased and sub-therapeutic) after 1 dose of 5mg  overnight. This is likely still effects of the vitamin K received on 8/21. H/H/platelets slowly trending down since admission but stable today. No bleeding documented. Note patient with CKD (SCr improving) and diet resumed.  Goal of Therapy:  INR 2-3   Plan:  1. Coumadin 7.5mg  po x1 tonight. 2. F/up daily PT/INR.   Link Snuffer, PharmD, BCPS Clinical Pharmacist [phone] 09/18/2012,8:37 AM

## 2012-09-18 NOTE — Progress Notes (Signed)
Patient ID: [name], female   DOB: 12-11-1915, 79 y.o.   MRN: [id number] Vital signs stable this morning. Patient's pain is not adequately controlled with the Tylenol, will add Ultram to her medical regimen.

## 2012-09-19 LAB — BASIC METABOLIC PANEL
CO2: 37 mEq/L — ABNORMAL HIGH (ref 19–32)
Calcium: 8.6 mg/dL (ref 8.4–10.5)
Chloride: 91 mEq/L — ABNORMAL LOW (ref 96–112)
Creatinine, Ser: 1.33 mg/dL — ABNORMAL HIGH (ref 0.50–1.10)
Glucose, Bld: 96 mg/dL (ref 70–99)

## 2012-09-19 LAB — CBC
HCT: 36.4 % (ref 36.0–46.0)
Hemoglobin: 12.1 g/dL (ref 12.0–15.0)
MCH: 30 pg (ref 26.0–34.0)
MCV: 90.3 fL (ref 78.0–100.0)
RBC: 4.03 MIL/uL (ref 3.87–5.11)
WBC: 11.6 10*3/uL — ABNORMAL HIGH (ref 4.0–10.5)

## 2012-09-19 LAB — PROTIME-INR: Prothrombin Time: 18.3 seconds — ABNORMAL HIGH (ref 11.6–15.2)

## 2012-09-19 MED ORDER — ASPIRIN 81 MG PO CHEW
CHEWABLE_TABLET | ORAL | Status: AC
  Filled 2012-09-19: qty 1

## 2012-09-19 MED ORDER — WARFARIN SODIUM 5 MG PO TABS
5.0000 mg | ORAL_TABLET | Freq: Once | ORAL | Status: AC
  Administered 2012-09-19: 5 mg via ORAL
  Filled 2012-09-19: qty 1

## 2012-09-19 NOTE — Evaluation (Signed)
Occupational Therapy Evaluation Patient Details Name: [name] MRN: [id number] DOB: 09/23/1915 Today's Date: 09/19/2012 Time: 1026-1050 OT Time Calculation (min): 24 min  OT Assessment / Plan / Recommendation History of present illness Patient is a 79 yo female s/p Rt hip hemiarthroplasty due to hip fracture from fall.  Patient with acute resp failure - now weened to Venturi mask.   Clinical Impression   Pt presents with generalized weakness, disorientation to place, and pain following hip fx and hemiarthroplasty.  She requires +2 assist for mobility and is dependent in toileting and LB ADL. Pt became dizzy and hypotensive during bed to chair transfer, this being her first time OOB.  Pt will need post acute rehab in a SNF.  Will follow acutely.    OT Assessment  Patient needs continued OT Services    Follow Up Recommendations  SNF    Barriers to Discharge      Equipment Recommendations  None recommended by OT    Recommendations for Other Services    Frequency  Min 2X/week    Precautions / Restrictions Precautions Precautions: Posterior Hip;Fall Precaution Booklet Issued: No Restrictions Weight Bearing Restrictions: Yes RLE Weight Bearing: Weight bearing as tolerated   Pertinent Vitals/Pain See flow sheet/PT note for BP, reported pain with transition movements R hip, did not rate, premedicated, repositioned    ADL  Eating/Feeding: Set up Where Assessed - Eating/Feeding: Bed level Grooming: Set up;Wash/dry hands;Wash/dry face;Brushing hair Where Assessed - Grooming: Supported sitting Upper Body Bathing: Minimal assistance Where Assessed - Upper Body Bathing: Unsupported sitting Lower Body Bathing: +1 Total assistance Where Assessed - Lower Body Bathing: Unsupported sitting;Supported sit to stand Upper Body Dressing: Set up Where Assessed - Upper Body Dressing: Unsupported sitting Lower Body Dressing: +1 Total assistance Where Assessed - Lower Body Dressing:  Unsupported sitting;Supported sit to stand Equipment Used: Gait belt;Knee Immobilizer;Rolling walker Transfers/Ambulation Related to ADLs: +2 total assist, pt 40% bed to chair, dizziness with standing    OT Diagnosis: Generalized weakness;Acute pain;Cognitive deficits  OT Problem List: Decreased strength;Decreased activity tolerance;Impaired balance (sitting and/or standing);Decreased cognition;Decreased knowledge of use of DME or AE;Decreased knowledge of precautions;Obesity;Pain OT Treatment Interventions: Self-care/ADL training;DME and/or AE instruction;Therapeutic activities;Patient/family education   OT Goals(Current goals can be found in the care plan section) Acute Rehab OT Goals Patient Stated Goal: Walk OT Goal Formulation: With patient Time For Goal Achievement: 11/03/12 Potential to Achieve Goals: Good ADL Goals Pt Will Transfer to Toilet: with mod assist;bedside commode Pt Will Perform Toileting - Clothing Manipulation and hygiene: with mod assist;sit to/from stand Additional ADL Goal #1: Pt will state posterior hip precautions with minimal verbal cues. Additional ADL Goal #2: Pt will perform supine to sit at EOB with mod assist in prep for ADL.  Visit Information  Last OT Received On: 09/19/12 Assistance Needed: +2 PT/OT Co-Evaluation/Treatment: Yes History of Present Illness: Patient is a 79 yo female s/p Rt hip hemiarthroplasty due to hip fracture from fall.  Patient with acute resp failure - now weened to Venturi mask.       Prior Functioning     Home Living Family/patient expects to be discharged to:: Skilled nursing facility Living Arrangements: Alone Home Equipment: Walker - 4 wheels Prior Function Level of Independence: Independent with assistive device(s) Comments: ambulates with a walker, sleeps in a lift chair/recliner...son does all of her grocery shopping, pays bills...pt states that she has difficulty bathing Communication Communication: No  difficulties Dominant Hand: Right  Vision/Perception Vision - History Baseline Vision: Wears glasses all the time (glasses are not here) Patient Visual Report: No change from baseline   Cognition  Cognition Arousal/Alertness: Awake/alert Behavior During Therapy: WFL for tasks assessed/performed Overall Cognitive Status: Impaired/Different from baseline Area of Impairment: Orientation Orientation Level: Disoriented to;Place;Time    Extremity/Trunk Assessment Upper Extremity Assessment Upper Extremity Assessment: Generalized weakness Lower Extremity Assessment Lower Extremity Assessment: Defer to PT evaluation RLE Deficits / Details: Unable to move RLE against gravity. RLE: Unable to fully assess due to pain LLE Deficits / Details: General weakness - strength 3/5 Cervical / Trunk Assessment Cervical / Trunk Assessment:  (pt states she walks upright)     Mobility Bed Mobility Bed Mobility: Supine to Sit;Sitting - Scoot to Edge of Bed Supine to Sit: 1: +2 Total assist Supine to Sit: Patient Percentage: 30% Sitting - Scoot to Edge of Bed: 1: +1 Total assist Details for Bed Mobility Assistance: Assist for LEs and to right trunk Transfers Transfers: Sit to Stand;Stand to Sit Sit to Stand: 1: +2 Total assist;With upper extremity assist;From bed Sit to Stand: Patient Percentage: 40% Stand to Sit: 1: +2 Total assist;With upper extremity assist;To chair/3-in-1 Stand to Sit: Patient Percentage: 30% Details for Transfer Assistance: Verbal cues for hand placement and to control descent.     Exercise     Balance Balance Balance Assessed: Yes Static Sitting Balance Static Sitting - Balance Support: Feet supported;No upper extremity supported Static Sitting - Level of Assistance: 5: Stand by assistance   End of Session OT - End of Session Activity Tolerance: Patient limited by fatigue;Treatment limited secondary to medical complications (Comment) (dizziness,  hypotensive) Patient left: in chair;with call bell/phone within reach  GO     Evern Bio 09/19/2012, 11:21 AM [phone]

## 2012-09-19 NOTE — Progress Notes (Signed)
ANTICOAGULATION CONSULT NOTE - Follow Up Consult  Pharmacy Consult for Coumadin Indication: recent PE, THA  Patient Measurements: Height: 5\' 2"  (157.5 cm) Weight: 188 lb 9.6 oz (85.548 kg) (bed scale) IBW/kg (Calculated) : 50.1 Vital Signs: Temp: 98.4 F (36.9 C) (08/25 1130) Temp src: Oral (08/25 1130) BP: 130/53 mmHg (08/25 0800) Pulse Rate: 84 (08/25 0402) Labs:  Recent Labs  09/17/12 0600 09/18/12 0530 09/19/12 0530 09/19/12 1125  HGB 12.8 12.0 12.1  --   HCT 38.3 36.4 36.4  --   PLT 183 203 216  --   LABPROT 15.3* 15.1  --  18.3*  INR 1.24 1.22  --  1.57*  CREATININE 1.33* 1.29* 1.33*  --     Estimated Creatinine Clearance: 25.7 ml/min (by C-G formula based on Cr of 1.33).  Assessment: 89 YOF on warfarin for recent PE and THA. INR today is 1.57 -starting to trend up. H/H/platelets slowly trending down since admission but stable.  No bleeding documented. Note patient with CKD (SCr stable 1.3) and diet resumed. Vancomycin 750mg  IV q24 stared for enterococcus / MRSA UTI.  Goal of Therapy:  INR 2-3   Plan:  1. Coumadin 5mg  po x1 tonight. 2. F/up daily PT/INR.   Leota Sauers Pharm.D. CPP, BCPS Clinical Pharmacist [phone] 09/19/2012 3:35 PM

## 2012-09-19 NOTE — Progress Notes (Addendum)
Physical Therapy Treatment Patient Details Name: [name] MRN: [id number] DOB: [birth year] Today's Date: 09/19/2012 Time: 1026-1050 PT Time Calculation (min): 24 min  PT Assessment / Plan / Recommendation  History of Present Illness Patient is a 79 yo female s/p Rt hip hemiarthroplasty due to hip fracture from fall.  Patient with acute resp failure - now weened to Venturi mask.   PT Comments   Pt admitted with above. Pt currently with functional limitations due to continued issues with pain, balance and cognition.  Pt will benefit from skilled PT to increase their independence and safety with mobility to allow discharge to the venue listed below.   Follow Up Recommendations  SNF                 Equipment Recommendations  None recommended by PT        Frequency Min 5X/week   Progress towards PT Goals Progress towards PT goals: Progressing toward goals  Plan Current plan remains appropriate    Precautions / Restrictions Precautions Precautions: Posterior Hip;Fall Precaution Booklet Issued: No Precaution Comments: Reviewed precautions with pt Restrictions Weight Bearing Restrictions: Yes RLE Weight Bearing: Weight bearing as tolerated   Pertinent Vitals/Pain O2 83-95% on 6L O2.  BP initially after ambulation 88/70 with pt symptomatic and BP after sitting in chair was 104/61. Some LE pain    Mobility  Bed Mobility Bed Mobility: Supine to Sit;Sitting - Scoot to Edge of Bed Supine to Sit: 1: +2 Total assist Supine to Sit: Patient Percentage: 30% Sitting - Scoot to Edge of Bed: 1: +1 Total assist Details for Bed Mobility Assistance: Assist for LEs and to right trunk Transfers Transfers: Sit to Stand;Stand to Sit;Stand Pivot Transfers Sit to Stand: 1: +2 Total assist;With upper extremity assist;From bed Sit to Stand: Patient Percentage: 40% Stand to Sit: 1: +2 Total assist;With upper extremity assist;To chair/3-in-1 Stand to Sit: Patient Percentage: 30% Stand Pivot  Transfers: 1: +2 Total assist Stand Pivot Transfers: Patient Percentage: 40% Details for Transfer Assistance: Verbal cues for hand placement and to control descent.Incr time to acheive full upright stance.   Ambulation/Gait Ambulation/Gait Assistance: Not tested (comment) Stairs: No Wheelchair Mobility Wheelchair Mobility: No    Exercises Total Joint Exercises Ankle Circles/Pumps: AROM;Both;10 reps;Supine Quad Sets: AROM;Both;10 reps;Seated  PT Goals (current goals can now be found in the care plan section) Acute Rehab PT Goals Patient Stated Goal: Walk  Visit Information  Last PT Received On: 09/19/12 Assistance Needed: +2 PT/OT Co-Evaluation/Treatment: Yes History of Present Illness: Patient is a 79 yo female s/p Rt hip hemiarthroplasty due to hip fracture from fall.  Patient with acute resp failure - now weened to Venturi mask.    Subjective Data  Subjective: "I am hurting a lot." Patient Stated Goal: Walk   Cognition  Cognition Arousal/Alertness: Awake/alert Behavior During Therapy: WFL for tasks assessed/performed Overall Cognitive Status: Impaired/Different from baseline Area of Impairment: Orientation Orientation Level: Disoriented to;Place;Time    Balance  Balance Balance Assessed: Yes Static Sitting Balance Static Sitting - Balance Support: Feet supported;No upper extremity supported Static Sitting - Level of Assistance: 5: Stand by assistance  End of Session PT - End of Session Equipment Utilized During Treatment: Gait belt;Oxygen Activity Tolerance: Patient limited by pain;Patient limited by fatigue Patient left: in chair;with call bell/phone within reach Nurse Communication: Mobility status;Need for lift equipment        [name],[name] 09/19/2012, 11:40 AM [facility] Acute Rehabilitation [phone] (pager)

## 2012-09-19 NOTE — Progress Notes (Signed)
Pt has become confused and suspicious of staff, attempted to reorient pt will continue to monitor

## 2012-09-19 NOTE — Progress Notes (Signed)
CSW provided list of bed offers for SNF facilities.   CSW spoke with the daughter-in-law [name]) in the hall to discuss Pt's wishes, as Pt was being moved at the time of visit. [name] stated that Pt was agreeable and that she feels [facility] would be the first choice, but would prefer CSW to speak with Pt's son [name] [phone] for further assistance.   CSW contacted son via phone to discuss d/c planning and facility choice. Pt's son stated that "more than likely the family would like [facility]", however he "would need to discuss that further with the Pt. CSW voiced understanding and stressed the importance of making a facility choice as soon as possible. Pt's son understands and will provide choice soon.    CSW to f/u for bed selection and further d/c planning.    Leron Croak, LCSWA [facility] Emergency Dept.  [phone]

## 2012-09-19 NOTE — Progress Notes (Signed)
[facility] PROGRESS NOTE  [name] BJY:[id number] DOB: November 28, 1915 DOA: 09/14/2012 PCP: Milana Obey, MD  HPI/Subjective: Patient is sleepy this morning, continue Lasix. Got good urine output with a Lasix last night, continue today. Was able to wean off of a stent to 6 L of insulin through nasal cannula.  Assessment/Plan:  Right hip fracture -Status post unipolar hemiarthroplasty. -Patient tolerating pain very well. PT/OT per orthopedics. -Patient probably will need skilled nursing facility.  Acute respiratory failure -With hypoxia, unclear etiology, but patient has chronic CHF (LVEF 63%/grade 1 DD) and recent history of PE.  -Oxygen through facial tent, this to wean 200 but her mouth. -IV fluids discontinued, resume Bumex. Chest x-ray did not show any evidence of pneumonia. -Wean off the oxygen as tolerated. -Diuresis increased, follow intake and output.  UTI -Urine cultures growing enterococcus and MRSA -Currently on vancomycin, can be discharged on amoxicillin and Bactrim.  Chronic atrial fibrillation -Currently on sinus rhythm, patient is on anticoagulation we will resume as it's okay with orthopedics.  History of PE   -Patient is on Coumadin, pharmacy asked to restart it.  CKD stage III  -Creatinine at baseline, baseline creatinine is 1.44, creatinine today is 1.29.  Code Status: DO NOT RESUSCITATE Family Communication: Plan discussed with the patient Disposition Plan: Remains inpatient, we will talk to the family about goals of care.   Consultants:  Orthopedics  Procedures:  Right hip hemiarthroplasty done on 09/16/2012 by Dr. Otelia Sergeant  Antibiotics:  None  Objective: Filed Vitals:   09/19/12 0800  BP: 130/53  Pulse:   Temp: 98.3 F (36.8 C)  Resp:     Intake/Output Summary (Last 24 hours) at 09/19/12 1029 Last data filed at 09/19/12 0800  Gross per 24 hour  Intake    270 ml  Output   3775 ml  Net  -3505 ml   Filed Weights   09/14/12 2243 09/15/12 0555 09/16/12 0603  Weight: 86.1 kg (189 lb 13.1 oz) 86.2 kg (190 lb 0.6 oz) 85.548 kg (188 lb 9.6 oz)    Exam: General: Alert and awake, oriented x3, not in any acute distress. HEENT: anicteric sclera, pupils reactive to light and accommodation, EOMI CVS: S1-S2 clear, no murmur rubs or gallops Chest: clear to auscultation bilaterally, no wheezing, rales or rhonchi Abdomen: soft nontender, nondistended, normal bowel sounds, no organomegaly Extremities: no cyanosis, clubbing or edema noted bilaterally Neuro: Cranial nerves II-XII intact, no focal neurological deficits  Data Reviewed: Basic Metabolic Panel:  Recent Labs Lab 09/16/12 0925 09/16/12 1100 09/17/12 0600 09/18/12 0530 09/19/12 0530  NA 138 136 136 135 135  K 3.9 4.0 4.4 3.9 2.9*  CL 100 100 102 101 91*  CO2 28 27 25 28  37*  GLUCOSE 120* 108* 109* 101* 96  BUN 21 21 18 21  24*  CREATININE 1.56* 1.49* 1.33* 1.29* 1.33*  CALCIUM 8.4 8.4 8.4 8.5 8.6   Liver Function Tests:  Recent Labs Lab 09/16/12 0925 09/17/12 0600 09/18/12 0530  AST 18 21 18   ALT 12 9 6   ALKPHOS 61 51 58  BILITOT 1.0 0.7 0.7  PROT 6.0 5.3* 5.4*  ALBUMIN 2.6* 2.0* 1.9*   No results found for this basename: LIPASE, AMYLASE,  in the last 168 hours No results found for this basename: AMMONIA,  in the last 168 hours CBC:  Recent Labs Lab 09/15/12 2038 09/16/12 1100 09/17/12 0600 09/18/12 0530 09/19/12 0530  WBC 12.5* 13.6* 14.2* 14.7* 11.6*  HGB 15.4* 14.8 12.8 12.0 12.1  HCT  45.2 43.5 38.3 36.4 36.4  MCV 91.5 90.8 91.2 91.2 90.3  PLT 200 197 183 203 216   Cardiac Enzymes:  Recent Labs Lab 09/14/12 1609  TROPONINI <0.30   BNP (last 3 results)  Recent Labs  03/19/12 1538 07/29/12 1421  PROBNP 445.4 427.6   CBG:  Recent Labs Lab 09/16/12 2140  GLUCAP 106*    Recent Results (from the past 240 hour(s))  URINE CULTURE     Status: None   Collection Time    09/15/12  2:17 PM      Result Value  Range Status   Specimen Description URINE, CATHETERIZED   Final   Special Requests Normal   Final   Culture  Setup Time     Final   Value: 09/15/2012 15:41     Performed at [facility] Count     Final   Value: >=100,000 COLONIES/ML     Performed at [facility]   Culture     Final   Value: ENTEROCOCCUS SPECIES     METHICILLIN RESISTANT STAPHYLOCOCCUS AUREUS     Note: RIFAMPIN AND GENTAMICIN SHOULD NOT BE USED AS SINGLE DRUGS FOR TREATMENT OF STAPH INFECTIONS.     Note: CRITICAL RESULT CALLED TO, READ BACK BY AND VERIFIED WITH: JOANNA LINDSAY ON 8.24.14 @ 1000 BY FERGK     Performed at [facility]   Report Status 09/18/2012 FINAL   Final   Organism ID, Bacteria ENTEROCOCCUS SPECIES   Final   Organism ID, Bacteria METHICILLIN RESISTANT STAPHYLOCOCCUS AUREUS   Final  CULTURE, BLOOD (ROUTINE X 2)     Status: None   Collection Time    09/15/12  2:59 PM      Result Value Range Status   Specimen Description BLOOD LEFT HAND   Final   Special Requests     Final   Value: BOTTLES DRAWN AEROBIC AND ANAEROBIC BLUE 10CC RED 6CC   Culture  Setup Time     Final   Value: 09/15/2012 22:27     Performed at [facility]   Culture     Final   Value:        BLOOD CULTURE RECEIVED NO GROWTH TO DATE CULTURE WILL BE HELD FOR 5 DAYS BEFORE ISSUING A FINAL NEGATIVE REPORT     Performed at [facility]   Report Status PENDING   Incomplete  CULTURE, BLOOD (ROUTINE X 2)     Status: None   Collection Time    09/15/12  3:32 PM      Result Value Range Status   Specimen Description BLOOD RIGHT ARM   Final   Special Requests BOTTLES DRAWN AEROBIC AND ANAEROBIC 10CC   Final   Culture  Setup Time     Final   Value: 09/15/2012 22:27     Performed at [facility]   Culture     Final   Value:        BLOOD CULTURE RECEIVED NO GROWTH TO DATE CULTURE WILL BE HELD FOR 5 DAYS BEFORE ISSUING A FINAL NEGATIVE REPORT     Performed at [facility]    Report Status PENDING   Incomplete  SURGICAL PCR SCREEN     Status: Abnormal   Collection Time    09/16/12  7:51 AM      Result Value Range Status   MRSA, PCR POSITIVE (*) NEGATIVE Final   Staphylococcus aureus POSITIVE (*) NEGATIVE Final   Comment:  The Xpert SA Assay (FDA     approved for NASAL specimens     in patients over 57 years of age),     is one component of     a comprehensive surveillance     program.  Test performance has     been validated by [employer] for patients greater     than or equal to 92 year old.     It is not intended     to diagnose infection nor to     guide or monitor treatment.  CULTURE, BLOOD (ROUTINE X 2)     Status: None   Collection Time    09/17/12  8:10 AM      Result Value Range Status   Specimen Description BLOOD LEFT HAND   Final   Special Requests BOTTLES DRAWN AEROBIC ONLY 10CC   Final   Culture  Setup Time     Final   Value: 09/17/2012 15:26     Performed at [facility]   Culture     Final   Value:        BLOOD CULTURE RECEIVED NO GROWTH TO DATE CULTURE WILL BE HELD FOR 5 DAYS BEFORE ISSUING A FINAL NEGATIVE REPORT     Performed at [facility]   Report Status PENDING   Incomplete  CULTURE, BLOOD (ROUTINE X 2)     Status: None   Collection Time    09/17/12  8:20 AM      Result Value Range Status   Specimen Description BLOOD RIGHT HAND   Final   Special Requests BOTTLES DRAWN AEROBIC ONLY 10CC   Final   Culture  Setup Time     Final   Value: 09/17/2012 15:26     Performed at [facility]   Culture     Final   Value:        BLOOD CULTURE RECEIVED NO GROWTH TO DATE CULTURE WILL BE HELD FOR 5 DAYS BEFORE ISSUING A FINAL NEGATIVE REPORT     Performed at [facility]   Report Status PENDING   Incomplete     Studies: No results found.  Scheduled Meds: . amLODipine  10 mg Oral Daily  . calcium-vitamin D  1 tablet Oral BID  . Chlorhexidine Gluconate Cloth  6 each Topical Q0600  .  docusate sodium  100 mg Oral BID  . feeding supplement  237 mL Oral BID BM  . ferrous sulfate  325 mg Oral BID  . furosemide  40 mg Intravenous Q8H  . heparin subcutaneous  5,000 Units Subcutaneous Q8H  . loratadine  10 mg Oral Daily  . multivitamin with minerals  1 tablet Oral Daily  . mupirocin ointment  1 application Nasal BID  . pantoprazole  40 mg Oral Daily  . vancomycin  750 mg Intravenous Q24H  . Warfarin - Pharmacist Dosing Inpatient   Does not apply q1800   Continuous Infusions:    Principal Problem:   Hip fracture, right Active Problems:   Unspecified essential hypertension   Pulmonary emboli   CKD (chronic kidney disease) stage 3, GFR 30-59 ml/min   Leukocytosis, unspecified   Chronic atrial fibrillation   UTI (urinary tract infection)    Time spent: 35 minutes    [facility] A  [facility] Pager [phone]. If 7PM-7AM, please contact night-coverage at [email or web address], password [facility] 09/19/2012, 10:29 AM  LOS: 5 days

## 2012-09-19 NOTE — Progress Notes (Signed)
Patient ID: [name], female   DOB: Aug 27, 1915, 79 y.o.   MRN: [id number] Subjective: 3 Days Post-Op Procedure(s) (LRB): ARTHROPLASTY BIPOLAR HIP (Right) Awake, alert and in good spirits. Oriented x 3. Patient reports pain as mild.    Objective:   VITALS:  Temp:  [98.1 F (36.7 C)-98.6 F (37 C)] 98.4 F (36.9 C) (08/25 1130) Pulse Rate:  [75-96] 84 (08/25 0402) Resp:  [17-28] 28 (08/25 0402) BP: (126-142)/(53-57) 130/53 mmHg (08/25 0800) SpO2:  [86 %-99 %] 95 % (08/25 0800) FiO2 (%):  [30 %-40 %] 40 % (08/25 0402)  Neurologically intact ABD soft Neurovascular intact Sensation intact distally Intact pulses distally Incision: no drainage No cellulitis present   LABS  Recent Labs  09/17/12 0600 09/18/12 0530 09/19/12 0530  HGB 12.8 12.0 12.1  WBC 14.2* 14.7* 11.6*  PLT 183 203 216    Recent Labs  09/18/12 0530 09/19/12 0530  NA 135 135  K 3.9 2.9*  CL 101 91*  CO2 28 37*  BUN 21 24*  CREATININE 1.29* 1.33*  GLUCOSE 101* 96    Recent Labs  09/18/12 0530 09/19/12 1125  INR 1.22 1.57*     Assessment/Plan: 3 Days Post-Op Procedure(s) (LRB): ARTHROPLASTY BIPOLAR HIP (Right)  Advance diet Up with therapy Discharge to SNF Hemoglobin in stabile. She is showing good improvement in her mental status. PT to get OOB and work on full weight bearing On the right operated hip side. UTI documented. On vancomycin now for results of uti. Unfortunately I did not know the results of The nasal swab prior to surgery and her pre operative coverage was with ancef. Will need to watch carefully for any signs of  Infection, presently no clinical signs of operative infection. Xrays post op reviewed with patient and family. Orthopaedically she is stable and when she is medically stable SNF.  NITKA,JAMES E 09/19/2012, 1:27 PM

## 2012-09-20 ENCOUNTER — Encounter (HOSPITAL_COMMUNITY): Payer: Self-pay | Admitting: Specialist

## 2012-09-20 DIAGNOSIS — K921 Melena: Secondary | ICD-10-CM

## 2012-09-20 LAB — BASIC METABOLIC PANEL
CO2: 39 mEq/L — ABNORMAL HIGH (ref 19–32)
Calcium: 8.6 mg/dL (ref 8.4–10.5)
GFR calc non Af Amer: 28 mL/min — ABNORMAL LOW (ref >90)
Glucose, Bld: 104 mg/dL — ABNORMAL HIGH (ref 70–99)
Potassium: 2.8 mEq/L — ABNORMAL LOW (ref 3.5–5.1)
Sodium: 139 mEq/L (ref 135–145)

## 2012-09-20 LAB — PROTIME-INR: INR: 2.02 — ABNORMAL HIGH (ref 0.00–1.49)

## 2012-09-20 MED ORDER — POTASSIUM CHLORIDE CRYS ER 20 MEQ PO TBCR
60.0000 meq | EXTENDED_RELEASE_TABLET | Freq: Four times a day (QID) | ORAL | Status: AC
  Administered 2012-09-20 (×2): 60 meq via ORAL
  Filled 2012-09-20: qty 3

## 2012-09-20 MED ORDER — POTASSIUM CHLORIDE CRYS ER 20 MEQ PO TBCR
EXTENDED_RELEASE_TABLET | ORAL | Status: AC
  Filled 2012-09-20: qty 3

## 2012-09-20 MED ORDER — BUMETANIDE 1 MG PO TABS
1.0000 mg | ORAL_TABLET | Freq: Every day | ORAL | Status: DC
  Administered 2012-09-20 – 2012-09-21 (×2): 1 mg via ORAL
  Filled 2012-09-20 (×2): qty 1

## 2012-09-20 MED ORDER — WARFARIN SODIUM 2.5 MG PO TABS
2.5000 mg | ORAL_TABLET | Freq: Once | ORAL | Status: AC
  Administered 2012-09-20: 2.5 mg via ORAL
  Filled 2012-09-20: qty 1

## 2012-09-20 NOTE — Progress Notes (Signed)
Subjective: 4 Days Post-Op Procedure(s) (LRB): ARTHROPLASTY BIPOLAR HIP (Right) Patient reports pain as mild.  Very drowsy this am.  Awakened for short time and tolerated being turned in bed to change dressing. Sat in chair yesterday Being transferred to tele today.  Objective: Vital signs in last 24 hours: Temp:  [98.2 F (36.8 C)-99.2 F (37.3 C)] 99.2 F (37.3 C) (08/26 0315) Pulse Rate:  [78] 78 (08/25 1530) Resp:  [19-20] 20 (08/26 0750) BP: (125-141)/(54-66) 127/54 mmHg (08/26 0750) SpO2:  [94 %-96 %] 94 % (08/26 0750)  Intake/Output from previous day: 08/25 0701 - 08/26 0700 In: 180 [P.O.:180] Out: 2325 [Urine:2325] Intake/Output this shift:     Recent Labs  09/18/12 0530 09/19/12 0530  HGB 12.0 12.1    Recent Labs  09/18/12 0530 09/19/12 0530  WBC 14.7* 11.6*  RBC 3.99 4.03  HCT 36.4 36.4  PLT 203 216    Recent Labs  09/19/12 0530 09/20/12 0420  NA 135 139  K 2.9* 2.8*  CL 91* 92*  CO2 37* 39*  BUN 24* 27*  CREATININE 1.33* 1.52*  GLUCOSE 96 104*  CALCIUM 8.6 8.6    Recent Labs  09/19/12 1125 09/20/12 0420  INR 1.57* 2.02*    Neurovascular intact Incision: moderate drainage and edema.  drainage is serosanguinous without purulence. Edema distal LEs.  Assessment/Plan: 4 Days Post-Op Procedure(s) (LRB): ARTHROPLASTY BIPOLAR HIP (Right) Up with therapy Discharge to SNF when medically stable. Dressing change as needed to keep as dry as possible and prevent skin breakdown. Turn pt frequently when in bed. Knee immobilizer discontinued.  Ziza Hastings M 09/20/2012, 8:47 AM

## 2012-09-20 NOTE — Progress Notes (Signed)
[facility] PROGRESS NOTE  [name] ZOX:[id number] DOB: 1915-03-15 DOA: 09/14/2012 PCP: Milana Obey, MD  HPI/Subjective: Looks much better, Denies any SOB or chest pain. Now on 4 L of O2 through [place].  Assessment/Plan:  Right hip fracture -Status post unipolar hemiarthroplasty. -Patient tolerating pain very well. PT/OT per orthopedics. -Patient will need skilled nursing facility.  Acute respiratory failure -With hypoxia, unclear etiology, but patient has chronic CHF (LVEF 63%/grade 1 DD) and recent history of PE.  -Oxygen through facial tent, weaned off gradually, now on [place] -IV fluids discontinued, resume Bumex. Chest x-ray did not show any evidence of pneumonia. -Wean off the oxygen as tolerated. -Diuresis increased, follow intake and output. D/C lasix and restart her home dose of diuretics.  UTI -Urine cultures growing enterococcus and MRSA -Currently on vancomycin, can be discharged on amoxicillin and Bactrim.  Chronic atrial fibrillation -Currently on sinus rhythm, patient is on anticoagulation we will resume as it's okay with orthopedics.  History of PE   -Patient is on Coumadin, pharmacy asked to restart it. Therapeutic range now.  CKD stage III  -Creatinine at baseline, baseline creatinine is 1.44, creatinine today is 1.52. -Will D/C lasix and place her back in her home dose of Bumex.  Code Status: DO NOT RESUSCITATE Family Communication: Plan discussed with the patient Disposition Plan: Remains inpatient, transfer to Tele bed TO SNF in 1-2 days if continues to improve   Consultants:  Orthopedics  Procedures:  Right hip hemiarthroplasty done on 09/16/2012 by Dr. Otelia Sergeant  Antibiotics:  Vancomycin Started on 8/23  Objective: Filed Vitals:   09/20/12 0315  BP:   Pulse:   Temp: 99.2 F (37.3 C)  Resp:     Intake/Output Summary (Last 24 hours) at 09/20/12 0723 Last data filed at 09/20/12 0300  Gross per 24 hour  Intake    180 ml   Output   2325 ml  Net  -2145 ml   Filed Weights   09/14/12 2243 09/15/12 0555 09/16/12 0603  Weight: 86.1 kg (189 lb 13.1 oz) 86.2 kg (190 lb 0.6 oz) 85.548 kg (188 lb 9.6 oz)    Exam: General: Alert and awake, oriented x3, not in any acute distress. HEENT: anicteric sclera, pupils reactive to light and accommodation, EOMI CVS: S1-S2 clear, no murmur rubs or gallops Chest: clear to auscultation bilaterally, no wheezing, rales or rhonchi Abdomen: soft nontender, nondistended, normal bowel sounds, no organomegaly Extremities: no cyanosis, clubbing or edema noted bilaterally Neuro: Cranial nerves II-XII intact, no focal neurological deficits  Data Reviewed: Basic Metabolic Panel:  Recent Labs Lab 09/16/12 1100 09/17/12 0600 09/18/12 0530 09/19/12 0530 09/20/12 0420  NA 136 136 135 135 139  K 4.0 4.4 3.9 2.9* 2.8*  CL 100 102 101 91* 92*  CO2 27 25 28  37* 39*  GLUCOSE 108* 109* 101* 96 104*  BUN 21 18 21  24* 27*  CREATININE 1.49* 1.33* 1.29* 1.33* 1.52*  CALCIUM 8.4 8.4 8.5 8.6 8.6   Liver Function Tests:  Recent Labs Lab 09/16/12 0925 09/17/12 0600 09/18/12 0530  AST 18 21 18   ALT 12 9 6   ALKPHOS 61 51 58  BILITOT 1.0 0.7 0.7  PROT 6.0 5.3* 5.4*  ALBUMIN 2.6* 2.0* 1.9*   No results found for this basename: LIPASE, AMYLASE,  in the last 168 hours No results found for this basename: AMMONIA,  in the last 168 hours CBC:  Recent Labs Lab 09/15/12 2038 09/16/12 1100 09/17/12 0600 09/18/12 0530 09/19/12 0530  WBC 12.5*  13.6* 14.2* 14.7* 11.6*  HGB 15.4* 14.8 12.8 12.0 12.1  HCT 45.2 43.5 38.3 36.4 36.4  MCV 91.5 90.8 91.2 91.2 90.3  PLT 200 197 183 203 216   Cardiac Enzymes:  Recent Labs Lab 09/14/12 1609  TROPONINI <0.30   BNP (last 3 results)  Recent Labs  03/19/12 1538 07/29/12 1421  PROBNP 445.4 427.6   CBG:  Recent Labs Lab 09/16/12 2140  GLUCAP 106*    Recent Results (from the past 240 hour(s))  URINE CULTURE     Status: None    Collection Time    09/15/12  2:17 PM      Result Value Range Status   Specimen Description URINE, CATHETERIZED   Final   Special Requests Normal   Final   Culture  Setup Time     Final   Value: 09/15/2012 15:41     Performed at [facility] Count     Final   Value: >=100,000 COLONIES/ML     Performed at [facility]   Culture     Final   Value: ENTEROCOCCUS SPECIES     METHICILLIN RESISTANT STAPHYLOCOCCUS AUREUS     Note: RIFAMPIN AND GENTAMICIN SHOULD NOT BE USED AS SINGLE DRUGS FOR TREATMENT OF STAPH INFECTIONS.     Note: CRITICAL RESULT CALLED TO, READ BACK BY AND VERIFIED WITH: JOANNA LINDSAY ON 8.24.14 @ 1000 BY FERGK     Performed at [facility]   Report Status 09/18/2012 FINAL   Final   Organism ID, Bacteria ENTEROCOCCUS SPECIES   Final   Organism ID, Bacteria METHICILLIN RESISTANT STAPHYLOCOCCUS AUREUS   Final  CULTURE, BLOOD (ROUTINE X 2)     Status: None   Collection Time    09/15/12  2:59 PM      Result Value Range Status   Specimen Description BLOOD LEFT HAND   Final   Special Requests     Final   Value: BOTTLES DRAWN AEROBIC AND ANAEROBIC BLUE 10CC RED 6CC   Culture  Setup Time     Final   Value: 09/15/2012 22:27     Performed at [facility]   Culture     Final   Value:        BLOOD CULTURE RECEIVED NO GROWTH TO DATE CULTURE WILL BE HELD FOR 5 DAYS BEFORE ISSUING A FINAL NEGATIVE REPORT     Performed at [facility]   Report Status PENDING   Incomplete  CULTURE, BLOOD (ROUTINE X 2)     Status: None   Collection Time    09/15/12  3:32 PM      Result Value Range Status   Specimen Description BLOOD RIGHT ARM   Final   Special Requests BOTTLES DRAWN AEROBIC AND ANAEROBIC 10CC   Final   Culture  Setup Time     Final   Value: 09/15/2012 22:27     Performed at [facility]   Culture     Final   Value:        BLOOD CULTURE RECEIVED NO GROWTH TO DATE CULTURE WILL BE HELD FOR 5 DAYS BEFORE ISSUING A FINAL  NEGATIVE REPORT     Performed at [facility]   Report Status PENDING   Incomplete  SURGICAL PCR SCREEN     Status: Abnormal   Collection Time    09/16/12  7:51 AM      Result Value Range Status   MRSA, PCR POSITIVE (*) NEGATIVE  Final   Staphylococcus aureus POSITIVE (*) NEGATIVE Final   Comment:            The Xpert SA Assay (FDA     approved for NASAL specimens     in patients over 6 years of age),     is one component of     a comprehensive surveillance     program.  Test performance has     been validated by [employer] for patients greater     than or equal to 21 year old.     It is not intended     to diagnose infection nor to     guide or monitor treatment.  CULTURE, BLOOD (ROUTINE X 2)     Status: None   Collection Time    09/17/12  8:10 AM      Result Value Range Status   Specimen Description BLOOD LEFT HAND   Final   Special Requests BOTTLES DRAWN AEROBIC ONLY 10CC   Final   Culture  Setup Time     Final   Value: 09/17/2012 15:26     Performed at [facility]   Culture     Final   Value:        BLOOD CULTURE RECEIVED NO GROWTH TO DATE CULTURE WILL BE HELD FOR 5 DAYS BEFORE ISSUING A FINAL NEGATIVE REPORT     Performed at [facility]   Report Status PENDING   Incomplete  CULTURE, BLOOD (ROUTINE X 2)     Status: None   Collection Time    09/17/12  8:20 AM      Result Value Range Status   Specimen Description BLOOD RIGHT HAND   Final   Special Requests BOTTLES DRAWN AEROBIC ONLY 10CC   Final   Culture  Setup Time     Final   Value: 09/17/2012 15:26     Performed at [facility]   Culture     Final   Value:        BLOOD CULTURE RECEIVED NO GROWTH TO DATE CULTURE WILL BE HELD FOR 5 DAYS BEFORE ISSUING A FINAL NEGATIVE REPORT     Performed at [facility]   Report Status PENDING   Incomplete     Studies: No results found.  Scheduled Meds: . amLODipine  10 mg Oral Daily  . calcium-vitamin D  1 tablet Oral BID  .  Chlorhexidine Gluconate Cloth  6 each Topical Q0600  . docusate sodium  100 mg Oral BID  . feeding supplement  237 mL Oral BID BM  . ferrous sulfate  325 mg Oral BID  . furosemide  40 mg Intravenous Q8H  . heparin subcutaneous  5,000 Units Subcutaneous Q8H  . loratadine  10 mg Oral Daily  . multivitamin with minerals  1 tablet Oral Daily  . mupirocin ointment  1 application Nasal BID  . pantoprazole  40 mg Oral Daily  . vancomycin  750 mg Intravenous Q24H  . Warfarin - Pharmacist Dosing Inpatient   Does not apply q1800   Continuous Infusions:    Principal Problem:   Hip fracture, right Active Problems:   Unspecified essential hypertension   Pulmonary emboli   CKD (chronic kidney disease) stage 3, GFR 30-59 ml/min   Leukocytosis, unspecified   Chronic atrial fibrillation   UTI (urinary tract infection)    Time spent: 35 minutes    [facility]  [facility] Pager [phone]. If 7PM-7AM,  please contact night-coverage at [email or web address], password [facility] 09/20/2012, 7:23 AM  LOS: 6 days

## 2012-09-20 NOTE — Progress Notes (Signed)
Physical Therapy Treatment Patient Details Name: [name] MRN: [id number] DOB: 12-05-1915 Today's Date: 09/20/2012 Time: 2841-3244 PT Time Calculation (min): 27 min  PT Assessment / Plan / Recommendation  History of Present Illness Patient is a 79 yo female s/p Rt hip hemiarthroplasty due to hip fracture from fall.  Patient with acute resp failure.  However now on [place].   PT Comments   Pt continues to be limited due to weakness and pain.  Pt having difficulty performing quad sets and activating bilateral LE (questionable if pt understanding exercise).  Will continue to assess.   Follow Up Recommendations  SNF     Equipment Recommendations  None recommended by PT    Frequency Min 5X/week   Progress towards PT Goals Progress towards PT goals: Progressing toward goals (slowly)  Plan Current plan remains appropriate    Precautions / Restrictions Precautions Precautions: Posterior Hip;Fall Precaution Booklet Issued: No Precaution Comments: Reviewed precautions with pt Restrictions Weight Bearing Restrictions: Yes RLE Weight Bearing: Weight bearing as tolerated   Pertinent Vitals/Pain C/o hip pain 8/10; premedicated and notified of RN     Mobility  Bed Mobility Bed Mobility: Supine to Sit;Sitting - Scoot to Edge of Bed Rolling Right: 1: +2 Total assist;With rail Rolling Right: Patient Percentage: 10% Sitting - Scoot to Edge of Bed: 1: +1 Total assist Details for Bed Mobility Assistance: Assist for LEs and to right trunk Transfers Transfers: Sit to Stand;Stand to Sit;Stand Pivot Transfers Sit to Stand: 1: +2 Total assist;With upper extremity assist;From bed Sit to Stand: Patient Percentage: 20% Stand to Sit: 1: +2 Total assist;With upper extremity assist;To chair/3-in-1 Stand to Sit: Patient Percentage: 20% Stand Pivot Transfers: 1: +2 Total assist Stand Pivot Transfers: Patient Percentage: 30% Details for Transfer Assistance: Verbal cues for hand placement and to  control descent. Pt unable to complete upright posture during transfer.  Manual (A) to advance hips to recliner. Ambulation/Gait Ambulation/Gait Assistance: Not tested (comment)    Exercises Total Joint Exercises Ankle Circles/Pumps: AROM;Both;10 reps;Supine Quad Sets: AROM;Both;10 reps;Seated (Poor activation noted after several attempts )   PT Diagnosis:    PT Problem List:   PT Treatment Interventions:     PT Goals (current goals can now be found in the care plan section) Acute Rehab PT Goals Patient Stated Goal: Walk PT Goal Formulation: With patient Time For Goal Achievement: 10/02/12 Potential to Achieve Goals: Good  Visit Information  Last PT Received On: 09/20/12 Assistance Needed: +2 History of Present Illness: Patient is a 79 yo female s/p Rt hip hemiarthroplasty due to hip fracture from fall.  Patient with acute resp failure.  However now on [place].    Subjective Data  Subjective: "I'm not sure how much I can do." Patient Stated Goal: Walk   Cognition  Cognition Arousal/Alertness: Awake/alert Behavior During Therapy: WFL for tasks assessed/performed Overall Cognitive Status: Within Functional Limits for tasks assessed    Balance  Balance Balance Assessed: Yes Static Sitting Balance Static Sitting - Balance Support: Feet supported;No upper extremity supported Static Sitting - Level of Assistance: 5: Stand by assistance  End of Session PT - End of Session Equipment Utilized During Treatment: Gait belt;Oxygen Activity Tolerance: Patient limited by pain;Patient limited by fatigue Patient left: in chair;with call bell/phone within reach Nurse Communication: Mobility status;Need for lift equipment   GP     Elize Pinon 09/20/2012, 11:03 AM  Jake Shark, PT DPT [phone]

## 2012-09-20 NOTE — Progress Notes (Signed)
Utilization review completed.  

## 2012-09-20 NOTE — Progress Notes (Signed)
Pt to TX to tele today, up to chair. Will continue to monitor.

## 2012-09-20 NOTE — Progress Notes (Signed)
ANTICOAGULATION CONSULT NOTE - Follow Up Consult  Pharmacy Consult for Coumadin/vancomycin Indication: recent PE, THA/ enterococcus / MRSA UTI.   Patient Measurements: Height: 5\' 2"  (157.5 cm) Weight: 188 lb 9.6 oz (85.548 kg) (bed scale) IBW/kg (Calculated) : 50.1 Vital Signs: Temp: 98 F (36.7 C) (08/26 1100) Temp src: Oral (08/26 1100) BP: 113/94 mmHg (08/26 1100) Labs:  Recent Labs  09/18/12 0530 09/19/12 0530 09/19/12 1125 09/20/12 0420  HGB 12.0 12.1  --   --   HCT 36.4 36.4  --   --   PLT 203 216  --   --   LABPROT 15.1  --  18.3* 22.2*  INR 1.22  --  1.57* 2.02*  CREATININE 1.29* 1.33*  --  1.52*    Estimated Creatinine Clearance: 22.5 ml/min (by C-G formula based on Cr of 1.52).  Assessment: 89 YOF on warfarin for recent PE and THA. INR today is 2.02 therapeutic but quite big jump from 1.57 yesterday. No bleeding documented.   Pt is also on day #4 Vancomycin 750mg  IV q24 for enterococcus / MRSA UTI. Scr slightly up to 1.52, est crcl ~ 22 ml/min, the vancomycin dose is still appropriate based on current renal function.   8/23 Vanc 750 q24 >>  8/21 Urine >> 100k Enterococcus, MRSA 8/23 Blood >> 8/22 Blood >> ngtd 8/22 MRSA PCR positive  Goal of Therapy:  INR 2-3 Vancomycin trough level 10-15 mcg/ml    Plan:  1. Coumadin 2.5mg  po x1 tonight. 2. F/up daily PT/INR.  3. Check vancomycin trough tomorrow  4. D/c sq heparin when if INR remains > 2 tomorrow  Bayard Hugger, PharmD, BCPS  Clinical Pharmacist  Pager: [phone]   09/20/2012 1:53 PM

## 2012-09-20 NOTE — Progress Notes (Signed)
Pt to TX to [id number]. Called report. Admin pain med & coumadin prior to [facility].

## 2012-09-21 ENCOUNTER — Inpatient Hospital Stay
Admission: RE | Admit: 2012-09-21 | Discharge: 2012-11-30 | Disposition: A | Discharge status: Home / Self Care | Payer: Medicare Other | Source: Ambulatory Visit | Attending: Family Medicine | Admitting: Family Medicine

## 2012-09-21 DIAGNOSIS — J9600 Acute respiratory failure, unspecified whether with hypoxia or hypercapnia: Secondary | ICD-10-CM

## 2012-09-21 DIAGNOSIS — N390 Urinary tract infection, site not specified: Secondary | ICD-10-CM

## 2012-09-21 LAB — CULTURE, BLOOD (ROUTINE X 2): Culture: NO GROWTH

## 2012-09-21 LAB — CBC
Hemoglobin: 11.5 g/dL — ABNORMAL LOW (ref 12.0–15.0)
MCHC: 31.5 g/dL (ref 30.0–36.0)
RBC: 3.97 MIL/uL (ref 3.87–5.11)
WBC: 8.4 10*3/uL (ref 4.0–10.5)

## 2012-09-21 LAB — BASIC METABOLIC PANEL
BUN: 30 mg/dL — ABNORMAL HIGH (ref 6–23)
Chloride: 91 mEq/L — ABNORMAL LOW (ref 96–112)
GFR calc Af Amer: 38 mL/min — ABNORMAL LOW (ref >90)
GFR calc non Af Amer: 33 mL/min — ABNORMAL LOW (ref >90)
Potassium: 3.7 mEq/L (ref 3.5–5.1)
Sodium: 137 mEq/L (ref 135–145)

## 2012-09-21 LAB — PROTIME-INR
INR: 2.81 — ABNORMAL HIGH (ref 0.00–1.49)
Prothrombin Time: 28.6 seconds — ABNORMAL HIGH (ref 11.6–15.2)

## 2012-09-21 MED ORDER — POTASSIUM CHLORIDE 10 MEQ/100ML IV SOLN
10.0000 meq | INTRAVENOUS | Status: DC
  Filled 2012-09-21 (×4): qty 100

## 2012-09-21 MED ORDER — DSS 100 MG PO CAPS: 100.0000 mg | ORAL_CAPSULE | Freq: Two times a day (BID) | ORAL | Status: AC

## 2012-09-21 MED ORDER — ONDANSETRON HCL 4 MG PO TABS: 4.0000 mg | ORAL_TABLET | Freq: Four times a day (QID) | ORAL | Status: AC | PRN

## 2012-09-21 MED ORDER — BISACODYL 10 MG RE SUPP: 10.0000 mg | Freq: Every day | RECTAL | Status: AC | PRN

## 2012-09-21 MED ORDER — ALPRAZOLAM 0.5 MG PO TABS: 0.5000 mg | ORAL_TABLET | ORAL | Status: AC

## 2012-09-21 MED ORDER — ENSURE COMPLETE PO LIQD: 237.0000 mL | Freq: Two times a day (BID) | ORAL | Status: AC

## 2012-09-21 MED ORDER — POLYETHYLENE GLYCOL 3350 17 G PO PACK: 17.0000 g | PACK | Freq: Every day | ORAL | Status: AC | PRN

## 2012-09-21 MED ORDER — TRAMADOL HCL 50 MG PO TABS: 25.0000 mg | ORAL_TABLET | Freq: Four times a day (QID) | ORAL | Status: AC | PRN

## 2012-09-21 MED ORDER — MENTHOL 3 MG MT LOZG: 1.0000 | LOZENGE | OROMUCOSAL | Status: AC | PRN

## 2012-09-21 MED ORDER — AMPICILLIN 500 MG PO CAPS: 500.0000 mg | ORAL_CAPSULE | Freq: Four times a day (QID) | ORAL | Status: DC

## 2012-09-21 MED ORDER — ACETAMINOPHEN 500 MG PO TABS: 1000.0000 mg | ORAL_TABLET | Freq: Four times a day (QID) | ORAL | Status: AC | PRN

## 2012-09-21 MED ORDER — ALUMINUM HYDROXIDE GEL 320 MG/5ML PO SUSP: 15.0000 mL | ORAL | Status: AC | PRN

## 2012-09-21 MED ORDER — METOCLOPRAMIDE HCL 5 MG PO TABS: 5.0000 mg | ORAL_TABLET | Freq: Three times a day (TID) | ORAL | Status: AC | PRN

## 2012-09-21 NOTE — Progress Notes (Signed)
[name] to be D/C'd Skilled nursing facility per MD order.  Discussed with the patient and all questions fully answered.    Medication List    STOP taking these medications       levofloxacin 500 MG tablet  Commonly known as:  LEVAQUIN      TAKE these medications       acetaminophen 500 MG tablet  Commonly known as:  TYLENOL  Take 2 tablets (1,000 mg total) by mouth every 6 (six) hours as needed.     ALPRAZolam 0.5 MG tablet  Commonly known as:  XANAX  Take 1 tablet (0.5 mg total) by mouth See admin instructions. TAKE ONE TABLET BY MOUTH UP TO TWICE DAILY FOR ANXIETY. Patient only takes one-half tablet twice daily as needed for anxiety     aluminum hydroxide 320 MG/5ML suspension  Commonly known as:  AMPHOJEL/ALTERNAGEL  Take 15-30 mLs by mouth every 4 (four) hours as needed.     amLODipine 10 MG tablet  Commonly known as:  NORVASC  Take 10 mg by mouth daily.     ampicillin 500 MG capsule  Commonly known as:  PRINCIPEN  Take 1 capsule (500 mg total) by mouth 4 (four) times daily.     bisacodyl 10 MG suppository  Commonly known as:  DULCOLAX  Place 1 suppository (10 mg total) rectally daily as needed.     bumetanide 1 MG tablet  Commonly known as:  BUMEX  Take 1 mg by mouth every other day.     calcium-vitamin D 500-200 MG-UNIT per tablet  Commonly known as:  OSCAL WITH D  Take 1 tablet by mouth 2 (two) times daily.     cetirizine 10 MG tablet  Commonly known as:  ZYRTEC  Take 10 mg by mouth daily. For allergy symptoms     colchicine 0.6 MG tablet  Take 0.6 mg by mouth daily as needed. For gout     DSS 100 MG Caps  Take 100 mg by mouth 2 (two) times daily.     feeding supplement Liqd  Take 237 mLs by mouth 2 (two) times daily between meals.     IRON SUPPLEMENT 325 (65 FE) MG tablet  Generic drug:  ferrous sulfate  Take 325 mg by mouth 2 (two) times daily.     menthol-cetylpyridinium 3 MG lozenge  Commonly known as:  CEPACOL  Take 1 lozenge (3 mg  total) by mouth as needed.     metoCLOPramide 5 MG tablet  Commonly known as:  REGLAN  Take 1-2 tablets (5-10 mg total) by mouth every 8 (eight) hours as needed (if ondansetron (ZOFRAN) ineffective.).     multivitamin tablet  Take 1 tablet by mouth daily.     ondansetron 4 MG tablet  Commonly known as:  ZOFRAN  Take 1 tablet (4 mg total) by mouth every 6 (six) hours as needed for nausea.     pantoprazole 40 MG tablet  Commonly known as:  PROTONIX  Take 1 tablet (40 mg total) by mouth daily.     polyethylene glycol packet  Commonly known as:  MIRALAX / GLYCOLAX  Take 17 g by mouth daily as needed.     potassium chloride 10 MEQ tablet  Commonly known as:  K-DUR,KLOR-CON  Take 10 mEq by mouth 2 (two) times daily.     traMADol 50 MG tablet  Commonly known as:  ULTRAM  Take 0.5 tablets (25 mg total) by mouth every 6 (six) hours as needed.  vitamin C 500 MG tablet  Commonly known as:  ASCORBIC ACID  Take 500 mg by mouth daily.     warfarin 2.5 MG tablet  Commonly known as:  COUMADIN  Take 2.5-5 mg by mouth daily. Takes one tablet for 2 days then takes two tablets for 1 day, then repeat        VVS, Skin clean, dry and intact without evidence of skin break down, no evidence of skin tears noted. IV catheter discontinued intact. Site without signs and symptoms of complications. Dressing and pressure applied.  An After Visit Summary was printed and given to the patient. Patient escorted via stretcher, and D/C to [facility] via [facility].  Kennyth Arnold D 09/21/2012 4:20 PM

## 2012-09-21 NOTE — Discharge Summary (Signed)
Physician Discharge Summary  [name] AVW:[id number] DOB: 03/19/1915 DOA: 09/14/2012  PCP: Milana Obey, MD  Admit date: 09/14/2012 Discharge date: 09/21/2012  Time spent: 50  minutes  Recommendations for Outpatient Follow-up:  1. Respiratory therapy needed at SNF  - Patient does not inspire deeply. Needs to be mobilized. 2. Physical therapy needed at SNF 3. Follow up with Orthopedic Surgeon 4. INR currently 2.81.  Will need INR check on 8/29. 5. With MRSA and enterococcus UTI - on Ampicillin for 5 more days. Recheck urine & culture in 2 weeks.  Right hip wound:  Change dressing as needed (daily to TID) to keep wound dry and prevent skin breakdown.   Discharge Diagnoses:  Principal Problem:   Hip fracture, right Active Problems:   Unspecified essential hypertension   Pulmonary emboli   CKD (chronic kidney disease) stage 3, GFR 30-59 ml/min   Leukocytosis, unspecified   Chronic atrial fibrillation   UTI (urinary tract infection)   Discharge Condition: stable but very weak.  Needing oxygen at 4 L N/C  Diet recommendation: D3 heart healthy  Filed Weights   09/14/12 2243 09/15/12 0555 09/16/12 0603  Weight: 86.1 kg (189 lb 13.1 oz) 86.2 kg (190 lb 0.6 oz) 85.548 kg (188 lb 9.6 oz)    History of present illness: at the time of admission:  [name] is a 79 y.o. female who presented fall on the day of admission. The fall was accidental in nature. The patient denies any dizziness, palpitations, chest pain, dyspnea, loss of consciousness associated with this fall. The family, who are present in the room, denied any other abnormalities noticed about the patient, particularly no evidence of confusion. The patient has a history of atrial fibrillation and also suffered a pulmonary embolus to the right lung in the beginning of July of this year. She is also hypertensive.  She is on chronic anticoagulation with Coumadin. She has been found to have a right hip fracture on CT  scanning.  Assessment/Plan:   Right hip fracture  -Status post unipolar hemiarthroplasty. 8/22 by Dr. Otelia Sergeant. -Patient tolerating pain very well. PT/OT needed at SNF -Wound currently with serosanguinous drainage which is decreasing. Change dressing as needed to keep wound dry and prevent skin breakdown. -Follow up with Dr. Otelia Sergeant in 14 days.  Acute respiratory failure  - Hospital course was complicated by acute respiratory failure, likely multifactorial from diastolic heart, chronic pulmonary embolism -With hypoxia, unclear etiology, but patient has chronic CHF (LVEF 63%/grade 1 DD) and recent history of PE.  -Oxygen through facial tent, weaned off gradually, now on  4 liters -IV fluids were discontinued, resumed Bumex. Chest x-ray did not show any evidence of pneumonia.  -Wean off the oxygen as tolerated. Mobilize, respiratory therapy at SNF -Patient was not on oxygen prior to admission.  This is concerning - family is aware of possible poor prognosis.  UTI  -Urine cultures growing enterococcus and MRSA  -Treated with vancomycin inpatient, will be discharged on 5 more days of ampicillin  Chronic atrial fibrillation  -Currently on sinus rhythm, Coumadin restarted after surgery. - INR needed 8/29 While at the skilled nursing facility, INR on discharge 2.8 on  History of PE  -Patient is on Coumadin, pharmacy asked to restart it. Therapeutic range now.   CKD stage III  -Creatinine at baseline, baseline creatinine is 1.44, creatinine today is 1.52.   Prognosis Patient is very frail elderly, although improving, likely has a very poor overall prognosis. If patient exhibits failure  to thrive syndrome, please consult palliative care in the future. Please consider a palliative medicine consult at rehab if patient does not improve over the next week.   Consultations:  Orthopedic surgery  Discharge Exam: Filed Vitals:   09/21/12 0558  BP: 127/76  Pulse: 76  Temp: 97.5 F (36.4 C)   Resp: 20    General: Alert and awake, oriented x3, not in any acute distress. Hoarse voice.  Ecchymosis surrounding right eye. HEENT: anicteric sclera, pupils reactive to light and accommodation, EOMI  CVS: S1-S2 clear, no murmur rubs or gallops  Chest: clear to auscultation bilaterally, no wheezing, rales or rhonchi, non productive cough, weak inspiration Abdomen: soft nontender, nondistended, normal bowel sounds, no organomegaly  Extremities: no cyanosis, clubbing or edema noted bilaterally  Neuro: Cranial nerves II-XII intact, no focal neurological deficits   Discharge Instructions      Discharge Orders   Future Orders Complete By Expires   Diet - low sodium heart healthy  As directed    Diet general  As directed    Comments:     D3 heart healthy   Full weight bearing  As directed    Increase activity slowly  As directed    Increase activity slowly  As directed    Posterior total hip precautions  As directed        Medication List    STOP taking these medications       levofloxacin 500 MG tablet  Commonly known as:  LEVAQUIN      TAKE these medications       acetaminophen 500 MG tablet  Commonly known as:  TYLENOL  Take 2 tablets (1,000 mg total) by mouth every 6 (six) hours as needed.     ALPRAZolam 0.5 MG tablet  Commonly known as:  XANAX  Take 1 tablet (0.5 mg total) by mouth See admin instructions. TAKE ONE TABLET BY MOUTH UP TO TWICE DAILY FOR ANXIETY. Patient only takes one-half tablet twice daily as needed for anxiety     aluminum hydroxide 320 MG/5ML suspension  Commonly known as:  AMPHOJEL/ALTERNAGEL  Take 15-30 mLs by mouth every 4 (four) hours as needed.     amLODipine 10 MG tablet  Commonly known as:  NORVASC  Take 10 mg by mouth daily.     ampicillin 500 MG capsule  Commonly known as:  PRINCIPEN  Take 1 capsule (500 mg total) by mouth 4 (four) times daily.     bisacodyl 10 MG suppository  Commonly known as:  DULCOLAX  Place 1 suppository  (10 mg total) rectally daily as needed.     bumetanide 1 MG tablet  Commonly known as:  BUMEX  Take 1 mg by mouth every other day.     calcium-vitamin D 500-200 MG-UNIT per tablet  Commonly known as:  OSCAL WITH D  Take 1 tablet by mouth 2 (two) times daily.     cetirizine 10 MG tablet  Commonly known as:  ZYRTEC  Take 10 mg by mouth daily. For allergy symptoms     colchicine 0.6 MG tablet  Take 0.6 mg by mouth daily as needed. For gout     DSS 100 MG Caps  Take 100 mg by mouth 2 (two) times daily.     feeding supplement Liqd  Take 237 mLs by mouth 2 (two) times daily between meals.     IRON SUPPLEMENT 325 (65 FE) MG tablet  Generic drug:  ferrous sulfate  Take 325 mg by mouth  2 (two) times daily.     menthol-cetylpyridinium 3 MG lozenge  Commonly known as:  CEPACOL  Take 1 lozenge (3 mg total) by mouth as needed.     metoCLOPramide 5 MG tablet  Commonly known as:  REGLAN  Take 1-2 tablets (5-10 mg total) by mouth every 8 (eight) hours as needed (if ondansetron (ZOFRAN) ineffective.).     multivitamin tablet  Take 1 tablet by mouth daily.     ondansetron 4 MG tablet  Commonly known as:  ZOFRAN  Take 1 tablet (4 mg total) by mouth every 6 (six) hours as needed for nausea.     pantoprazole 40 MG tablet  Commonly known as:  PROTONIX  Take 1 tablet (40 mg total) by mouth daily.     polyethylene glycol packet  Commonly known as:  MIRALAX / GLYCOLAX  Take 17 g by mouth daily as needed.     potassium chloride 10 MEQ tablet  Commonly known as:  K-DUR,KLOR-CON  Take 10 mEq by mouth 2 (two) times daily.     traMADol 50 MG tablet  Commonly known as:  ULTRAM  Take 0.5 tablets (25 mg total) by mouth every 6 (six) hours as needed.     vitamin C 500 MG tablet  Commonly known as:  ASCORBIC ACID  Take 500 mg by mouth daily.     warfarin 2.5 MG tablet  Commonly known as:  COUMADIN  Take 2.5-5 mg by mouth daily. Takes one tablet for 2 days then takes two tablets for 1  day, then repeat       No Known Allergies Follow-up Information   Follow up with NITKA,JAMES E, MD. Call in 2 weeks.   Specialty:  Orthopedic Surgery   Contact information:   [address] [place] [address] [phone]        The results of significant diagnostics from this hospitalization (including imaging, microbiology, ancillary and laboratory) are listed below for reference.    Significant Diagnostic Studies: Dg Wrist Complete Right  09/14/2012   *RADIOLOGY REPORT*  Clinical Data: Pain and bruising post fall  RIGHT WRIST - COMPLETE 3+ VIEW  Comparison: 02/19/2005  Findings: Osseous demineralization. Degenerative changes at first [facility] joint and at STT joint. Minimal narrowing of radiocarpal joint. No acute fracture, dislocation, or bone destruction. Soft tissue calcification at the distal forearm is unchanged. No definite acute bony abnormalities seen.  IMPRESSION: Osseous demineralization with degenerative changes in wrist as above. No acute abnormalities.   Original Report Authenticated By: Ulyses Southward, M.D.   Dg Hip Complete Right  09/14/2012   *RADIOLOGY REPORT*  Clinical Data:  Fall  RIGHT HIP - COMPLETE 2+ VIEW  Comparison: None  Findings: Leg is rotated on AP view. Diffuse osseous demineralization. Questionable angular deformity at the junction of the head/neck. Unable to exclude right femoral neck fracture. No dislocation seen. Visualized pelvis appears intact.  IMPRESSION: Rotated exam. Question angular deformity at the junction of the right femoral head/neck, unable to exclude fracture; recommend CT right hip without contrast to evaluate.   Original Report Authenticated By: Ulyses Southward, M.D.   Dg Femur Right  09/14/2012   *RADIOLOGY REPORT*  Clinical Data:  Right femur and hip pain post fall  RIGHT FEMUR - 2 VIEW  Comparison: None.  Findings: Rotated on AP view. Questionable angular deformity at the junction of the right femoral head/neck, unable to exclude fracture.  Remainder of the right femur appears intact. Bones diffusely demineralized. No additional fracture or dislocation.  IMPRESSION: Unable to exclude fracture at the junction of the right femoral head/neck; recommend CT imaging of the right hip without contrast or further evaluation.   Original Report Authenticated By: Ulyses Southward, M.D.   Ct Head Wo Contrast  09/14/2012   *RADIOLOGY REPORT*  Clinical Data: Fall, headache  CT HEAD WITHOUT CONTRAST,CT CERVICAL SPINE WITHOUT CONTRAST  Technique:  Contiguous axial images were obtained from the base of the skull through the vertex without contrast.,Technique: Multidetector CT imaging of the cervical spine was performed. Multiplanar CT image reconstructions were also generated.  Comparison: None.  Findings: No skull fracture is noted.  There is a right peri orbital and right lateral extra orbital soft tissue swelling. Bilateral eye globe is symmetrical in appearance.  No intraorbital hematoma.  There is mucosal thickening posterior aspect of the left sphenoid sinus.  Atherosclerotic calcifications of carotid siphon are noted.  No intracranial hemorrhage, mass effect or midline shift.  Moderate cerebral atrophy.  Lacunar infarct is noted in the right basal ganglia.  Periventricular and subcortical white matter decreased attenuation probable due to chronic small vessel ischemic changes. No acute cortical infarction.  No mass lesion is noted on this unenhanced scan. Moderate cerebral atrophy.  Mild periventricular white matter decreased attenuation probable due to chronic small vessel ischemic changes.  IMPRESSION:  1.  There is right periorbital and right lateral frontal soft tissue swelling.  No intraorbital hematoma. 2.  No skull fracture is noted. 3. Moderate cerebral atrophy.  Mild periventricular white matter decreased attenuation probable due to chronic small vessel ischemic changes.  No definite acute cortical infarction.  CT cervical spine without IV contrast:  Axial  images of the cervical spine shows no acute fracture or subluxation.  There is no pneumothorax in visualized lung apices.  Computer processed images shows no acute fracture or subluxation. Atherosclerotic calcifications of thoracic aorta and carotid arteries.  Diffuse osteopenia.  Degenerative changes thoracic articulation. Mild disc space flattening with mild posterior spurring at C5-C6 level.  Mild disc space flattening with mild anterior and moderate posterior spurring at C6-C7 level.  No prevertebral soft tissue swelling.  Cervical airway is patent.  Mild spinal canal stenosis due to posterior spurring at C6-C7 level.  Impression: 1.  No acute fracture or subluxation.  Diffuse osteopenia. Degenerative changes as described above.   Original Report Authenticated By: Natasha Mead, M.D.   Ct Cervical Spine Wo Contrast  09/14/2012   *RADIOLOGY REPORT*  Clinical Data: Fall, headache  CT HEAD WITHOUT CONTRAST,CT CERVICAL SPINE WITHOUT CONTRAST  Technique:  Contiguous axial images were obtained from the base of the skull through the vertex without contrast.,Technique: Multidetector CT imaging of the cervical spine was performed. Multiplanar CT image reconstructions were also generated.  Comparison: None.  Findings: No skull fracture is noted.  There is a right peri orbital and right lateral extra orbital soft tissue swelling. Bilateral eye globe is symmetrical in appearance.  No intraorbital hematoma.  There is mucosal thickening posterior aspect of the left sphenoid sinus.  Atherosclerotic calcifications of carotid siphon are noted.  No intracranial hemorrhage, mass effect or midline shift.  Moderate cerebral atrophy.  Lacunar infarct is noted in the right basal ganglia.  Periventricular and subcortical white matter decreased attenuation probable due to chronic small vessel ischemic changes. No acute cortical infarction.  No mass lesion is noted on this unenhanced scan. Moderate cerebral atrophy.  Mild periventricular  white matter decreased attenuation probable due to chronic small vessel ischemic changes.  IMPRESSION:  1.  There is right periorbital and right lateral frontal soft tissue swelling.  No intraorbital hematoma. 2.  No skull fracture is noted. 3. Moderate cerebral atrophy.  Mild periventricular white matter decreased attenuation probable due to chronic small vessel ischemic changes.  No definite acute cortical infarction.  CT cervical spine without IV contrast:  Axial images of the cervical spine shows no acute fracture or subluxation.  There is no pneumothorax in visualized lung apices.  Computer processed images shows no acute fracture or subluxation. Atherosclerotic calcifications of thoracic aorta and carotid arteries.  Diffuse osteopenia.  Degenerative changes thoracic articulation. Mild disc space flattening with mild posterior spurring at C5-C6 level.  Mild disc space flattening with mild anterior and moderate posterior spurring at C6-C7 level.  No prevertebral soft tissue swelling.  Cervical airway is patent.  Mild spinal canal stenosis due to posterior spurring at C6-C7 level.  Impression: 1.  No acute fracture or subluxation.  Diffuse osteopenia. Degenerative changes as described above.   Original Report Authenticated By: Natasha Mead, M.D.   Dg Pelvis Portable  09/16/2012   *RADIOLOGY REPORT*  Clinical Data: Postop right hip  PORTABLE PELVIS  Comparison: 09/14/2012  Findings: The patient is status post operative repair of the right femoral neck fracture. There is right hip prosthesis  in anatomic alignment.  Postsurgical changes are noted with the lateral skin staples.  A surgical drain in place is noted.  IMPRESSION: Right hip prosthesis in anatomic alignment.  Postsurgical changes are noted.   Original Report Authenticated By: Natasha Mead, M.D.   Dg Pelvis Portable  09/14/2012   *RADIOLOGY REPORT*  Clinical Data: Fall.  Right hip pain  PORTABLE PELVIS  Comparison: 03/28/2011  Findings: No definite  fracture.  If the patient has  significant right hip pain and cannot ambulate, further dedicated imaging of the right hip is suggested.  There is mild degenerative change in the right hip and moderate degenerative change and spurring in the left hip.  IMPRESSION: No definite displaced fracture is identified.  Further detailed imaging of the right hip may be helpful if the patient has persistent pain.   Original Report Authenticated By: Janeece Riggers, M.D.   Ct Hip Right Wo Contrast  09/14/2012   *RADIOLOGY REPORT*  Clinical Data: Right hip pain secondary to a fall today.  CT OF THE RIGHT HIP WITHOUT CONTRAST  Technique:  Multidetector CT imaging was performed according to the standard protocol. Multiplanar CT image reconstructions were also generated.  Comparison: Radiographs dated 09/14/2012  Findings: There is an impacted slightly angulated fracture of the subcapital region of the right femoral neck.  Minimal displacement.  Moderate arthritic changes of the right hip.  No other abnormality.  IMPRESSION: Subcapital fracture of the right femoral neck.   Original Report Authenticated By: Francene Boyers, M.D.   Dg Chest Port 1 View  09/16/2012   *RADIOLOGY REPORT*  Clinical Data: Postoperative hypoxemia following hip surgery, hard 10 waking up from anesthesia, apneic episode  PORTABLE CHEST - 1 VIEW  Comparison: Portable exam 1626 hours compared to 09/14/2012 Correlation:  CT chest 07/29/2012  Findings: Enlargement of cardiac silhouette with pulmonary vascular congestion. Atherosclerotic calcification aorta. Perihilar infiltrates question edema and CHF. Bowel identified at the inferior left hemithorax corresponding to a markedly elevated left hemi diaphragm and large hiatal hernia as better visualized on recent CT. No gross pleural effusion or pneumothorax. Bones demineralized.  IMPRESSION: Enlargement of cardiac silhouette with pulmonary vascular congestion and perihilar infiltrates question pulmonary edema/CHF.  Markedly elevated left  diaphragm as well as a large hiatal hernia.   Original Report Authenticated By: Ulyses Southward, M.D.   Dg Chest Port 1 View  09/14/2012   *RADIOLOGY REPORT*  Clinical Data: Fall  PORTABLE CHEST - 1 VIEW  Comparison: 07/29/2012  Findings: Cardiac enlargement.  Large hiatal hernia overlying the heart is unchanged.  There is left lower lobe atelectasis.  Negative for heart failure or pneumonia.  IMPRESSION: Large left hiatal hernia with left lower lobe atelectasis.  No acute abnormality.   Original Report Authenticated By: Janeece Riggers, M.D.    Microbiology: Recent Results (from the past 240 hour(s))  URINE CULTURE     Status: None   Collection Time    09/15/12  2:17 PM      Result Value Range Status   Specimen Description URINE, CATHETERIZED   Final   Special Requests Normal   Final   Culture  Setup Time     Final   Value: 09/15/2012 15:41     Performed at [facility] Count     Final   Value: >=100,000 COLONIES/ML     Performed at [facility]   Culture     Final   Value: ENTEROCOCCUS SPECIES     METHICILLIN RESISTANT STAPHYLOCOCCUS AUREUS     Note: RIFAMPIN AND GENTAMICIN SHOULD NOT BE USED AS SINGLE DRUGS FOR TREATMENT OF STAPH INFECTIONS.     Note: CRITICAL RESULT CALLED TO, READ BACK BY AND VERIFIED WITH: JOANNA LINDSAY ON 8.24.14 @ 1000 BY FERGK     Performed at [facility]   Report Status 09/18/2012 FINAL   Final   Organism ID, Bacteria ENTEROCOCCUS SPECIES   Final   Organism ID, Bacteria METHICILLIN RESISTANT STAPHYLOCOCCUS AUREUS   Final  CULTURE, BLOOD (ROUTINE X 2)     Status: None   Collection Time    09/15/12  2:59 PM      Result Value Range Status   Specimen Description BLOOD LEFT HAND   Final   Special Requests     Final   Value: BOTTLES DRAWN AEROBIC AND ANAEROBIC BLUE 10CC RED 6CC   Culture  Setup Time     Final   Value: 09/15/2012 22:27     Performed at [facility]   Culture     Final   Value: NO  GROWTH 5 DAYS     Performed at [facility]   Report Status 09/21/2012 FINAL   Final  CULTURE, BLOOD (ROUTINE X 2)     Status: None   Collection Time    09/15/12  3:32 PM      Result Value Range Status   Specimen Description BLOOD RIGHT ARM   Final   Special Requests BOTTLES DRAWN AEROBIC AND ANAEROBIC 10CC   Final   Culture  Setup Time     Final   Value: 09/15/2012 22:27     Performed at [facility]   Culture     Final   Value: NO GROWTH 5 DAYS     Performed at [facility]   Report Status 09/21/2012 FINAL   Final  SURGICAL PCR SCREEN     Status: Abnormal   Collection Time    09/16/12  7:51 AM      Result Value Range Status   MRSA, PCR POSITIVE (*) NEGATIVE Final   Staphylococcus aureus POSITIVE (*) NEGATIVE Final   Comment:            The Xpert  SA Assay (FDA     approved for NASAL specimens     in patients over 60 years of age),     is one component of     a comprehensive surveillance     program.  Test performance has     been validated by [employer] for patients greater     than or equal to 55 year old.     It is not intended     to diagnose infection nor to     guide or monitor treatment.  CULTURE, BLOOD (ROUTINE X 2)     Status: None   Collection Time    09/17/12  8:10 AM      Result Value Range Status   Specimen Description BLOOD LEFT HAND   Final   Special Requests BOTTLES DRAWN AEROBIC ONLY 10CC   Final   Culture  Setup Time     Final   Value: 09/17/2012 15:26     Performed at [facility]   Culture     Final   Value:        BLOOD CULTURE RECEIVED NO GROWTH TO DATE CULTURE WILL BE HELD FOR 5 DAYS BEFORE ISSUING A FINAL NEGATIVE REPORT     Performed at [facility]   Report Status PENDING   Incomplete  CULTURE, BLOOD (ROUTINE X 2)     Status: None   Collection Time    09/17/12  8:20 AM      Result Value Range Status   Specimen Description BLOOD RIGHT HAND   Final   Special Requests BOTTLES DRAWN AEROBIC ONLY  10CC   Final   Culture  Setup Time     Final   Value: 09/17/2012 15:26     Performed at [facility]   Culture     Final   Value:        BLOOD CULTURE RECEIVED NO GROWTH TO DATE CULTURE WILL BE HELD FOR 5 DAYS BEFORE ISSUING A FINAL NEGATIVE REPORT     Performed at [facility]   Report Status PENDING   Incomplete     Labs: Basic Metabolic Panel:  Recent Labs Lab 09/17/12 0600 09/18/12 0530 09/19/12 0530 09/20/12 0420 09/21/12 0605  NA 136 135 135 139 137  K 4.4 3.9 2.9* 2.8* 3.7  CL 102 101 91* 92* 91*  CO2 25 28 37* 39* 40*  GLUCOSE 109* 101* 96 104* 116*  BUN 18 21 24* 27* 30*  CREATININE 1.33* 1.29* 1.33* 1.52* 1.32*  CALCIUM 8.4 8.5 8.6 8.6 8.9   Liver Function Tests:  Recent Labs Lab 09/16/12 0925 09/17/12 0600 09/18/12 0530  AST 18 21 18   ALT 12 9 6   ALKPHOS 61 51 58  BILITOT 1.0 0.7 0.7  PROT 6.0 5.3* 5.4*  ALBUMIN 2.6* 2.0* 1.9*    CBC:  Recent Labs Lab 09/16/12 1100 09/17/12 0600 09/18/12 0530 09/19/12 0530 09/21/12 0605  WBC 13.6* 14.2* 14.7* 11.6* 8.4  HGB 14.8 12.8 12.0 12.1 11.5*  HCT 43.5 38.3 36.4 36.4 36.5  MCV 90.8 91.2 91.2 90.3 91.9  PLT 197 183 203 216 297   Cardiac Enzymes:  Recent Labs Lab 09/14/12 1609  TROPONINI <0.30   BNP: BNP (last 3 results)  Recent Labs  03/19/12 1538 07/29/12 1421  PROBNP 445.4 427.6   CBG:  Recent Labs Lab 09/16/12 2140  GLUCAP 106*     SignedConley Canal [phone] [facility]  09/21/2012, 11:34 AM  Attending - Very frail 79 year old female status post hip fracture and repair, hospital course complicated by acute hypoxic respiratory failure. Has significantly improved over the past few days, she is back on her usual dosing of Bumex and Coumadin. Lungs are mostly clear on exam, her main problem now is that she is very deconditioned. She would benefit from transfer to a skilled nursing facility to begin rehabilitation. She is stable with her  medical conditions, oxygen can be weaned off at the skilled nursing facility. Electrolytes and INR will need to be checked closely. She has a very poor overall prognosis given worsening deconditioning frailty and advanced age, if she were to exhibit signs of failure to thrive and not improve adequately in the next few weeks, I would consider a palliative care consult. Algis Downs spoke with the patient's son and outlined the above plan and prognosis as well. He was agreeable.  S Manali Mcelmurry

## 2012-09-21 NOTE — Progress Notes (Signed)
Subjective: 5 Days Post-Op Procedure(s) (LRB): ARTHROPLASTY BIPOLAR HIP (Right) Patient reports pain as mild.  Sitting up in bed eating breakfast. Denies pain at rest. Much more alert today  Objective: Vital signs in last 24 hours: Temp:  [97.5 F (36.4 C)-98.1 F (36.7 C)] 97.5 F (36.4 C) (08/27 0558) Pulse Rate:  [70-81] 76 (08/27 0558) Resp:  [18-20] 20 (08/27 0558) BP: (113-127)/(60-94) 127/76 mmHg (08/27 0558) SpO2:  [96 %-98 %] 96 % (08/27 0558)  Intake/Output from previous day: 08/26 0701 - 08/27 0700 In: 120 [P.O.:120] Out: 1150 [Urine:1150] Intake/Output this shift:     Recent Labs  09/19/12 0530 09/21/12 0605  HGB 12.1 11.5*    Recent Labs  09/19/12 0530 09/21/12 0605  WBC 11.6* 8.4  RBC 4.03 3.97  HCT 36.4 36.5  PLT 216 297    Recent Labs  09/20/12 0420 09/21/12 0605  NA 139 137  K 2.8* 3.7  CL 92* 91*  CO2 39* 40*  BUN 27* 30*  CREATININE 1.52* 1.32*  GLUCOSE 104* 116*  CALCIUM 8.6 8.9    Recent Labs  09/20/12 0420 09/21/12 0605  INR 2.02* 2.81*    Neurovascular intact Dorsiflexion/Plantar flexion intact Incision: still with serosang drainage but less today Less edema of legs today  Assessment/Plan: 5 Days Post-Op Procedure(s) (LRB): ARTHROPLASTY BIPOLAR HIP (Right) Up with therapy Discharge to SNF when ok with medical team  Tea Collums M 09/21/2012, 9:29 AM

## 2012-09-21 NOTE — Progress Notes (Signed)
Patient examined and lab reviewed with Vernon PA-C. 

## 2012-09-21 NOTE — Clinical Social Work Placement (Signed)
Clinical Social Work Department CLINICAL SOCIAL WORK PLACEMENT NOTE 09/21/2012  Patient:  [name], [name]  Account Number:  [id number] Admit date:  09/14/2012  Clinical Social Worker:  Samuella Bruin, Theresia Majors  Date/time:  09/17/2012 03:36 PM  Clinical Social Work is seeking post-discharge placement for this patient at the following level of care:   SKILLED NURSING   (*CSW will update this form in Epic as items are completed)   09/17/2012  Patient/family provided with [facility] Department of Clinical Social Work's list of facilities offering this level of care within the geographic area requested by the patient (or if unable, by the patient's family).  09/17/2012  Patient/family informed of their freedom to choose among providers that offer the needed level of care, that participate in Medicare, Medicaid or managed care program needed by the patient, have an available bed and are willing to accept the patient.  09/17/2012  Patient/family informed of [facility]' ownership interest in [facility], as well as of the fact that they are under no obligation to receive care at this facility.  PASARR submitted to EDS on 09/16/2012 PASARR number received from EDS on 09/16/2012  FL2 transmitted to all facilities in geographic area requested by pt/family on  09/17/2012 FL2 transmitted to all facilities within larger geographic area on 09/17/2012  Patient informed that his/her managed care company has contracts with or will negotiate with  certain facilities, including the following:     Patient/family informed of bed offers received:  09/21/2012 Patient chooses bed at [facility] Physician recommends and patient chooses bed at    Patient to be transferred to [facility] on  09/21/2012 Patient to be transferred to facility by Ambulance  The following physician request were entered in Epic:   Additional Comments: Per MD patient ready for DC 09/21/12. Patient  will DC to [facility]. Facility and family notified. Son Jesusita Oka will complete paper work at facility. CSW has requested ambulance transport. CSW signing off.  Roddie Mc, [place], [place], [phone]

## 2012-09-21 NOTE — Progress Notes (Signed)
CRITICAL VALUE ALERT  Critical value received:  CO2=40 / /Date of notification:  09/21/2012  Time of notification:  0825 Critical value read back:yes  Nurse who received alert:  Beckey Downing RN MD notified (1st page):  Dr. Elyn Peers Time of first page:  0826 MD notified (2nd page):  Time of second page:  Responding MD: Dr.York  Time MD responded: [phone]

## 2012-09-23 LAB — CULTURE, BLOOD (ROUTINE X 2): Culture: NO GROWTH

## 2012-09-24 NOTE — H&P (Signed)
[name], [name] NO.:  [id number]  MEDICAL RECORD NO.:  [id number]  LOCATION:  S124                          FACILITY:  [facility]  PHYSICIAN:  Mila Homer. Sudie Bailey, M.D.DATE OF BIRTH:  1915-05-03  DATE OF ADMISSION:  09/21/2012 DATE OF DISCHARGE:  LH                             HISTORY & PHYSICAL   This 79 year old incurred a fracture of her right hip.  She was walking across the kitchen floor when suddenly the hip gave away and she landed on the floor and bruised up her right face.  She was transported to [place], [place], where she had surgery on her right hip.  She had problems with acute respiratory failure and with chronic atrial fibrillation.  She was found to have MRSA and enterococcus growing out of her urine.  She was treated with vancomycin IV in the hospital for 7 days and also put on ampicillin for enterococcus, which she has been taking at the nursing home.  She was continued on warfarin because of her chronic atrial fibrillation.  Back in early July, she was found to have small pulmonary emboli on a CT angiogram of the chest.  At that time, it was also noted that she had a huge hiatal hernia with compressive changes of the lung due to the hiatal hernia and atherosclerosis of the thoracic aorta.  She is currently doing well and feels a good deal better.  Her family is with her in the nursing home.  Her speech is normal and her sensorium appears to be intact.  Her affect is good.  She is able to smile.  Her hearing is fairly good.  She has got bruising of the right side of the face and the right external eye with still some swelling there.  She also has some bruising in the right lateral neck.  Her heart has a regular rhythm today and rate of about 80.  Her lungs appeared clear throughout.  She is moving air well.  There is no edema of the ankles present.  ASSESSMENT: 1. Recent fracture of the right hip. 2. Chronic atrial  fibrillation. 3. Warfarin, anticoagulation. 4. Recent methicillin-resistant Staph aureus urinary tract infection. 5. Recent Enterococcal urinary tract infection. 6. Stage III chronic kidney disease. 7. Status post acute respiratory failure.  She is currently on acetaminophen 500 mg 2 tablets q.6 hours p.r.n. pain, alprazolam 0.5 mg up to 1 b.i.d., aluminium hydroxide 50 to 30 mL q.4 hours for reflux, amlodipine 10 mg daily, ampicillin 500 mg q.i.d. for a total of 5 days after reaching the nursing home, bisacodyl 10 mg suppository daily, bumetanide 1 mg every other day, calcium with vitamin D 500/200 b.i.d., cetirizine 10 mg daily p.r.n. allergy, colchicine 0.6 mg daily p.r.n. gout, iron supplement 325 mg b.i.d., Cepacol lozenges p.r.n., metoclopramide 5 mg 1 or 2 tablets q.8 hours.  For nausea ondansetron is ineffective, ondansetron 4 mg by mouth every 6 hours for nausea, pantoprazole 40 mg daily, polyethylene glycol 17 g in water daily, potassium chloride 10 mEq b.i.d., tramadol 50 mg half a tablet q.6 hours p.r.n., vitamin C 500 mg daily, warfarin 2.5 mg at present 1 tablet for day 1  and day 2, and then 2 tablets for day 3.  PLAN:  She will have to be followed up by her orthopedist and she will need another urine culture in about 2 weeks.     Mila Homer. Sudie Bailey, M.D.     SDK/MEDQ  D:  09/23/2012  T:  09/24/2012  Job:  [id number]

## 2012-11-12 NOTE — Progress Notes (Signed)
NAMEMABEL, ROLL NO.:  [id number]  MEDICAL RECORD NO.:  [id number]  LOCATION:  S124                          FACILITY:  [facility]  PHYSICIAN:  Mila Homer. Sudie Bailey, M.D.DATE OF BIRTH:  1915-12-22  DATE OF PROCEDURE: DATE OF DISCHARGE:                                PROGRESS NOTE   SUBJECTIVE:  This resident of the [facility] is generally doing well.  She is still having pain in the right hip, which she fractured some time ago.  She continues to have physical therapy.  She complains of having a "cold."  She does have a history of allergic rhinitis, which is seasonal.  She continues on warfarin for chronic atrial fibrillation.  OBJECTIVE:  GENERAL:  She is sitting up in a wheelchair.  She is oriented and alert.  She is well-developed and overweight. NEUROLOGIC:  Her speech is normal.  Her sensorium is intact.  Her affect is good.  She appears very very intelligent and sharp, despite her 89+ years. HEART:  Fairly regular rhythm today rate of 70. LUNGS:  Clear throughout. EXTREMITIES:  There is at least 1+ edema of the distal legs.  There is some erythema there as well.  She is tender on palpation, but this was normal for her. She has an erythematous patch to the left cheek.  ASSESSMENT: 1. Continued recovery from a fracture of the right hip with repair at     [facility]. 2. Chronic atrial fibrillation. 3. Warfarin anticoagulation. 4. Recent methicillin-resistant Staphylococcus aureus urinary tract     infection. 5. Acne rosacea. 6. Allergic rhinitis.  PLAN:  I talked to her nurse.  She is currently on cetirizine 10 mg daily and we will continue with this.  As soon as orthopedics feels that she is ready for discharge home  I will be happy to authorize this.  Meanwhile, she will continue her current meds and treatments.     Mila Homer. Sudie Bailey, M.D.     SDK/MEDQ  D:  11/12/2012  T:  11/12/2012  Job:  [id number]

## 2013-01-30 ENCOUNTER — Ambulatory Visit (INDEPENDENT_AMBULATORY_CARE_PROVIDER_SITE_OTHER): Payer: Medicare Other | Admitting: Adult Health

## 2013-01-30 ENCOUNTER — Encounter: Payer: Self-pay | Admitting: Adult Health

## 2013-01-30 VITALS — BP 107/74 | HR 97 | Ht 59.0 in | Wt 168.0 lb

## 2013-01-30 DIAGNOSIS — I482 Chronic atrial fibrillation, unspecified: Secondary | ICD-10-CM

## 2013-01-30 DIAGNOSIS — I10 Essential (primary) hypertension: Secondary | ICD-10-CM

## 2013-01-30 DIAGNOSIS — I2510 Atherosclerotic heart disease of native coronary artery without angina pectoris: Secondary | ICD-10-CM

## 2013-01-30 DIAGNOSIS — I4891 Unspecified atrial fibrillation: Secondary | ICD-10-CM

## 2013-01-30 NOTE — Progress Notes (Deleted)
Name: [name]    DOB: 10-20-1915  Age: 80 y.o.  MR#: [id number]       PCP:  Milana ObeyKNOWLTON,STEPHEN D, MD      Insurance: Payor: MEDICARE / Plan: MEDICARE PART A AND B / Product Type: *No Product type* /   CC:   No chief complaint on file.   VS Filed Vitals:   01/30/13 1403  BP: 107/74  Pulse: 97  Height: 4\' 11"  (1.499 m)  Weight: 168 lb (76.204 kg)    Weights Current Weight  01/30/13 168 lb (76.204 kg)  09/16/12 188 lb 9.6 oz (85.548 kg)  09/16/12 188 lb 9.6 oz (85.548 kg)    Blood Pressure  BP Readings from Last 3 Encounters:  01/30/13 107/74  09/21/12 127/76  09/21/12 127/76     Admit date:  (Not on file) Last encounter with RMR:  Visit date not found   Allergy Review of patient's allergies indicates no known allergies.  Current Outpatient Prescriptions  Medication Sig Dispense Refill  . acetaminophen (TYLENOL) 500 MG tablet Take 2 tablets (1,000 mg total) by mouth every 6 (six) hours as needed.  30 tablet  0  . ALPRAZolam (XANAX) 0.5 MG tablet Take 1 tablet (0.5 mg total) by mouth See admin instructions. TAKE ONE TABLET BY MOUTH UP TO TWICE DAILY FOR ANXIETY. Patient only takes one-half tablet twice daily as needed for anxiety  30 tablet  0  . aluminum hydroxide (AMPHOJEL/ALTERNAGEL) 320 MG/5ML suspension Take 15-30 mLs by mouth every 4 (four) hours as needed.  150 mL  0  . amLODipine (NORVASC) 10 MG tablet Take 10 mg by mouth daily.        . bisacodyl (DULCOLAX) 10 MG suppository Place 1 suppository (10 mg total) rectally daily as needed.  12 suppository  0  . bumetanide (BUMEX) 1 MG tablet Take 1 mg by mouth every other day.       . calcium-vitamin D (OSCAL WITH D) 500-200 MG-UNIT per tablet Take 1 tablet by mouth 2 (two) times daily.       . cetirizine (ZYRTEC) 10 MG tablet Take 10 mg by mouth daily. For allergy symptoms      . colchicine 0.6 MG tablet Take 0.6 mg by mouth daily as needed. For gout      . docusate sodium 100 MG CAPS Take 100 mg by mouth 2 (two)  times daily.  10 capsule  0  . feeding supplement (ENSURE COMPLETE) LIQD Take 237 mLs by mouth 2 (two) times daily between meals.      . ferrous sulfate (IRON SUPPLEMENT) 325 (65 FE) MG tablet Take 325 mg by mouth 2 (two) times daily.       . furosemide (LASIX) 40 MG tablet Take 40 mg by mouth daily.      . hydrochlorothiazide (HYDRODIURIL) 25 MG tablet Take 12.5 mg by mouth daily.      Marland Kitchen. levothyroxine (SYNTHROID, LEVOTHROID) 25 MCG tablet Take 12.5 mcg by mouth daily before breakfast.      . menthol-cetylpyridinium (CEPACOL) 3 MG lozenge Take 1 lozenge (3 mg total) by mouth as needed.  100 tablet  12  . metoCLOPramide (REGLAN) 5 MG tablet Take 1-2 tablets (5-10 mg total) by mouth every 8 (eight) hours as needed (if ondansetron (ZOFRAN) ineffective.).  30 tablet  0  . Multiple Vitamin (MULTIVITAMIN) tablet Take 1 tablet by mouth daily.        . ondansetron (ZOFRAN) 4 MG tablet Take 1 tablet (4 mg  total) by mouth every 6 (six) hours as needed for nausea.  20 tablet  0  . polyethylene glycol (MIRALAX / GLYCOLAX) packet Take 17 g by mouth daily as needed.  14 each  0  . potassium chloride (K-DUR,KLOR-CON) 10 MEQ tablet Take 10 mEq by mouth 2 (two) times daily.       . traMADol (ULTRAM) 50 MG tablet Take 0.5 tablets (25 mg total) by mouth every 6 (six) hours as needed.  30 tablet  0  . vitamin C (ASCORBIC ACID) 500 MG tablet Take 500 mg by mouth daily.        Marland Kitchen warfarin (COUMADIN) 2.5 MG tablet Take 2.5-5 mg by mouth daily. Takes one tablet for 2 days then takes two tablets for 1 day, then repeat       No current facility-administered medications for this visit.    Discontinued Meds:    Medications Discontinued During This Encounter  Medication Reason  . ampicillin (PRINCIPEN) 500 MG capsule Error  . pantoprazole (PROTONIX) 40 MG tablet Error    Patient Active Problem List   Diagnosis Date Noted  . UTI (urinary tract infection) 09/17/2012  . Chronic atrial fibrillation 09/16/2012  .  Leukocytosis, unspecified 09/15/2012  . Hip fracture, right 09/14/2012  . Pulmonary emboli 07/29/2012  . Acute respiratory failure 07/29/2012  . CKD (chronic kidney disease) stage 3, GFR 30-59 ml/min 07/29/2012  . CAD, NATIVE VESSEL 07/30/2009  . COLONIC POLYPS, HX OF 08/31/2008  . ANEMIA 08/29/2008  . SCHATZKI'S RING 08/29/2008  . HIATAL HERNIA 08/29/2008  . HEMOCCULT POSITIVE STOOL 08/29/2008  . HYPERLIPIDEMIA, MILD 08/10/2008  . Unspecified essential hypertension 08/10/2008  . GASTROESOPHAGEAL REFLUX DISEASE 08/10/2008  . DYSPNEA 08/10/2008  . CHEST PAIN 08/10/2008    LABS    Component Value Date/Time   NA 137 09/21/2012 0605   NA 139 09/20/2012 0420   NA 135 09/19/2012 0530   K 3.7 09/21/2012 0605   K 2.8* 09/20/2012 0420   K 2.9* 09/19/2012 0530   CL 91* 09/21/2012 0605   CL 92* 09/20/2012 0420   CL 91* 09/19/2012 0530   CO2 40* 09/21/2012 0605   CO2 39* 09/20/2012 0420   CO2 37* 09/19/2012 0530   GLUCOSE 116* 09/21/2012 0605   GLUCOSE 104* 09/20/2012 0420   GLUCOSE 96 09/19/2012 0530   BUN 30* 09/21/2012 0605   BUN 27* 09/20/2012 0420   BUN 24* 09/19/2012 0530   CREATININE 1.32* 09/21/2012 0605   CREATININE 1.52* 09/20/2012 0420   CREATININE 1.33* 09/19/2012 0530   CALCIUM 8.9 09/21/2012 0605   CALCIUM 8.6 09/20/2012 0420   CALCIUM 8.6 09/19/2012 0530   GFRNONAA 33* 09/21/2012 0605   GFRNONAA 28* 09/20/2012 0420   GFRNONAA 33* 09/19/2012 0530   GFRAA 38* 09/21/2012 0605   GFRAA 32* 09/20/2012 0420   GFRAA 38* 09/19/2012 0530   CMP     Component Value Date/Time   NA 137 09/21/2012 0605   K 3.7 09/21/2012 0605   CL 91* 09/21/2012 0605   CO2 40* 09/21/2012 0605   GLUCOSE 116* 09/21/2012 0605   BUN 30* 09/21/2012 0605   CREATININE 1.32* 09/21/2012 0605   CALCIUM 8.9 09/21/2012 0605   PROT 5.4* 09/18/2012 0530   ALBUMIN 1.9* 09/18/2012 0530   AST 18 09/18/2012 0530   ALT 6 09/18/2012 0530   ALKPHOS 58 09/18/2012 0530   BILITOT 0.7 09/18/2012 0530   GFRNONAA 33* 09/21/2012 0605   GFRAA 38*  09/21/2012 5409  Component Value Date/Time   WBC 8.4 09/21/2012 0605   WBC 11.6* 09/19/2012 0530   WBC 14.7* 09/18/2012 0530   HGB 11.5* 09/21/2012 0605   HGB 12.1 09/19/2012 0530   HGB 12.0 09/18/2012 0530   HCT 36.5 09/21/2012 0605   HCT 36.4 09/19/2012 0530   HCT 36.4 09/18/2012 0530   MCV 91.9 09/21/2012 0605   MCV 90.3 09/19/2012 0530   MCV 91.2 09/18/2012 0530    Lipid Panel  No results found for this basename: chol, trig, hdl, cholhdl, vldl, ldlcalc    ABG No results found for this basename: phart, pco2, pco2art, po2, po2art, hco3, tco2, acidbasedef, o2sat     Lab Results  Component Value Date   TSH 2.425 ***Test methodology is 3rd generation TSH**** 05/28/2009   BNP (last 3 results)  Recent Labs  03/19/12 1538 07/29/12 1421  PROBNP 445.4 427.6   Cardiac Panel (last 3 results) No results found for this basename: CKTOTAL, CKMB, TROPONINI, RELINDX,  in the last 72 hours  Iron/TIBC/Ferritin No results found for this basename: iron, tibc, ferritin     EKG Orders placed during the hospital encounter of 09/14/12  . EKG 12-LEAD  . EKG 12-LEAD  . EKG 12-LEAD  . EKG 12-LEAD  . EKG     Prior Assessment and Plan Problem List as of 01/30/2013     Cardiovascular and Mediastinum   Unspecified essential hypertension   CAD, NATIVE VESSEL   Pulmonary emboli   Chronic atrial fibrillation     Respiratory   HIATAL HERNIA   Acute respiratory failure     Digestive   SCHATZKI'S RING   GASTROESOPHAGEAL REFLUX DISEASE     Musculoskeletal and Integument   Hip fracture, right     Genitourinary   CKD (chronic kidney disease) stage 3, GFR 30-59 ml/min   UTI (urinary tract infection)     Other   HYPERLIPIDEMIA, MILD   ANEMIA   HEMOCCULT POSITIVE STOOL   DYSPNEA   CHEST PAIN   COLONIC POLYPS, HX OF   Leukocytosis, unspecified       Imaging: No results found.

## 2013-01-30 NOTE — Assessment & Plan Note (Signed)
She is slightly hypotensive today. I will stop lasix today. Recheck BMET for kidney fx.

## 2013-01-30 NOTE — Progress Notes (Signed)
HPI: [name] is a 80 y/o former patient of Dr.Rothbart who has been lost to follow up since 2012, we are seeing post hospitalization for ongoing assessment and management of atrial fib, hypertension. She is on chronic anticoagulation with coumadin and is followed by SNF physician Dr. Knox Saliva.    She was recently admitted to [facility] for COPD exacerbation and bronchitis. She is continued on Augementin for treatment at this visit. She continues to have complaints of cough and congestion.She is working with Rehab at [facility] as she has had a recent hip replacement as well. She is quite deconditioned. She is without complaints of chest pain, but has chronic dyspnea. She is accompanied by her son.   No Known Allergies  Current Outpatient Prescriptions  Medication Sig Dispense Refill  . acetaminophen (TYLENOL) 500 MG tablet Take 2 tablets (1,000 mg total) by mouth every 6 (six) hours as needed.  30 tablet  0  . ALPRAZolam (XANAX) 0.5 MG tablet Take 1 tablet (0.5 mg total) by mouth See admin instructions. TAKE ONE TABLET BY MOUTH UP TO TWICE DAILY FOR ANXIETY. Patient only takes one-half tablet twice daily as needed for anxiety  30 tablet  0  . aluminum hydroxide (AMPHOJEL/ALTERNAGEL) 320 MG/5ML suspension Take 15-30 mLs by mouth every 4 (four) hours as needed.  150 mL  0  . amLODipine (NORVASC) 10 MG tablet Take 10 mg by mouth daily.        . bisacodyl (DULCOLAX) 10 MG suppository Place 1 suppository (10 mg total) rectally daily as needed.  12 suppository  0  . bumetanide (BUMEX) 1 MG tablet Take 1 mg by mouth every other day.       . calcium-vitamin D (OSCAL WITH D) 500-200 MG-UNIT per tablet Take 1 tablet by mouth 2 (two) times daily.       . cetirizine (ZYRTEC) 10 MG tablet Take 10 mg by mouth daily. For allergy symptoms      . colchicine 0.6 MG tablet Take 0.6 mg by mouth daily as needed. For gout      . docusate sodium 100 MG CAPS Take 100 mg by mouth 2 (two) times daily.  10  capsule  0  . feeding supplement (ENSURE COMPLETE) LIQD Take 237 mLs by mouth 2 (two) times daily between meals.      . ferrous sulfate (IRON SUPPLEMENT) 325 (65 FE) MG tablet Take 325 mg by mouth 2 (two) times daily.       . furosemide (LASIX) 40 MG tablet Take 40 mg by mouth daily.      . hydrochlorothiazide (HYDRODIURIL) 25 MG tablet Take 12.5 mg by mouth daily.      Marland Kitchen levothyroxine (SYNTHROID, LEVOTHROID) 25 MCG tablet Take 12.5 mcg by mouth daily before breakfast.      . menthol-cetylpyridinium (CEPACOL) 3 MG lozenge Take 1 lozenge (3 mg total) by mouth as needed.  100 tablet  12  . metoCLOPramide (REGLAN) 5 MG tablet Take 1-2 tablets (5-10 mg total) by mouth every 8 (eight) hours as needed (if ondansetron (ZOFRAN) ineffective.).  30 tablet  0  . Multiple Vitamin (MULTIVITAMIN) tablet Take 1 tablet by mouth daily.        . ondansetron (ZOFRAN) 4 MG tablet Take 1 tablet (4 mg total) by mouth every 6 (six) hours as needed for nausea.  20 tablet  0  . polyethylene glycol (MIRALAX / GLYCOLAX) packet Take 17 g by mouth daily as needed.  14 each  0  .  potassium chloride (K-DUR,KLOR-CON) 10 MEQ tablet Take 10 mEq by mouth 2 (two) times daily.       . traMADol (ULTRAM) 50 MG tablet Take 0.5 tablets (25 mg total) by mouth every 6 (six) hours as needed.  30 tablet  0  . vitamin C (ASCORBIC ACID) 500 MG tablet Take 500 mg by mouth daily.        Marland Kitchen. warfarin (COUMADIN) 2.5 MG tablet Take 2.5-5 mg by mouth daily. Takes one tablet for 2 days then takes two tablets for 1 day, then repeat       No current facility-administered medications for this visit.    Past Medical History  Diagnosis Date  . Hyperlipidemia, mild   . GERD (gastroesophageal reflux disease)   . Essential hypertension   . Chest pain   . Pneumonia     Past Surgical History  Procedure Laterality Date  . Appendectomy    . Hip arthroplasty Right 09/16/2012    Procedure: ARTHROPLASTY BIPOLAR HIP;  Surgeon: Kerrin ChampagneJames E Nitka, MD;  [facility];  Service: Orthopedics;  Laterality: Right;    ROS:.NCROS  PHYSICAL EXAM BP 107/74  Pulse 97  Ht 4\' 11"  (1.499 m)  Wt 168 lb (76.204 kg)  BMI 33.91 kg/m2 General: Well developed, well nourished, in no acute distress Head: Eyes PERRLA,postive for xanthomas.   Normal cephalic and atramatic  Lungs: Some bibasilar crackles with congested coughing. Heart: HRIR S1 S2, with soft systolic murmur.   Pulses are 2+ & equal.            No carotid bruit. No JVD.  No abdominal bruits. No femoral bruits. Abdomen: Bowel sounds are positive, abdomen soft and non-tender without masses or                  Hernia's noted. Msk:  Back normal, normal gait. Normal strength and tone for age. Extremities: No clubbing, cyanosis or edema.  DP +1 Neuro: Alert and oriented X 3. Hard of hearing. Psych:  Good affect, responds appropriately  EKG: Atrial fibrillation rate of 106.   ASSESSMENT AND PLAN

## 2013-01-30 NOTE — Patient Instructions (Addendum)
Your physician recommends that you schedule a follow-up appointment in: 1 month with Dr Wyline [facility]anch [facility].  Your physician has recommended you make the following change in your medication:  1. Stop Lasix Please use when needed  Your physician recommends that you return for lab work in: 1 week BMET  Your physician has requested that you have an echocardiogram. Echocardiography is a painless test that uses sound waves to create images of your heart. It provides your doctor with information about the size and shape of your heart and how well your heart's chambers and valves are working. This procedure takes approximately one hour. There are no restrictions for this procedure.

## 2013-01-30 NOTE — Assessment & Plan Note (Signed)
HR is elevated today. She is on both Bumex, HCTZ and lasix. She may be dehydrated causing elevated HR. I will have echo completed as she has not had one in over 2 years. Last echo demonstrated Grade II diastolic dysfunction with normal systolic function. She is to continue on coumadin with INR and [facility].

## 2013-01-30 NOTE — Assessment & Plan Note (Signed)
No complaints of chest pain. She is sedentary at this point. No other planned testing until echo is completed. She does not wish invasive testing or intervention. Will review echo for help with medical management.

## 2013-02-06 ENCOUNTER — Ambulatory Visit (HOSPITAL_COMMUNITY): Payer: Medicare Other | Attending: Cardiovascular Disease

## 2013-03-03 ENCOUNTER — Encounter: Payer: Medicare Other | Admitting: Cardiology

## 2013-03-03 NOTE — Progress Notes (Signed)
    ERROR. No show 

## 2013-03-06 ENCOUNTER — Ambulatory Visit: Payer: Medicare Other | Admitting: Cardiology

## 2013-03-26 DEATH — deceased

## 2015-01-15 IMAGING — CR DG CHEST 1V PORT
1 series · 1 of 1 positions shown · non-contrast
Comparison: 07/18/2012

CLINICAL DATA: Shortness of breath and weakness

PORTABLE CHEST - 1 VIEW

[portable]
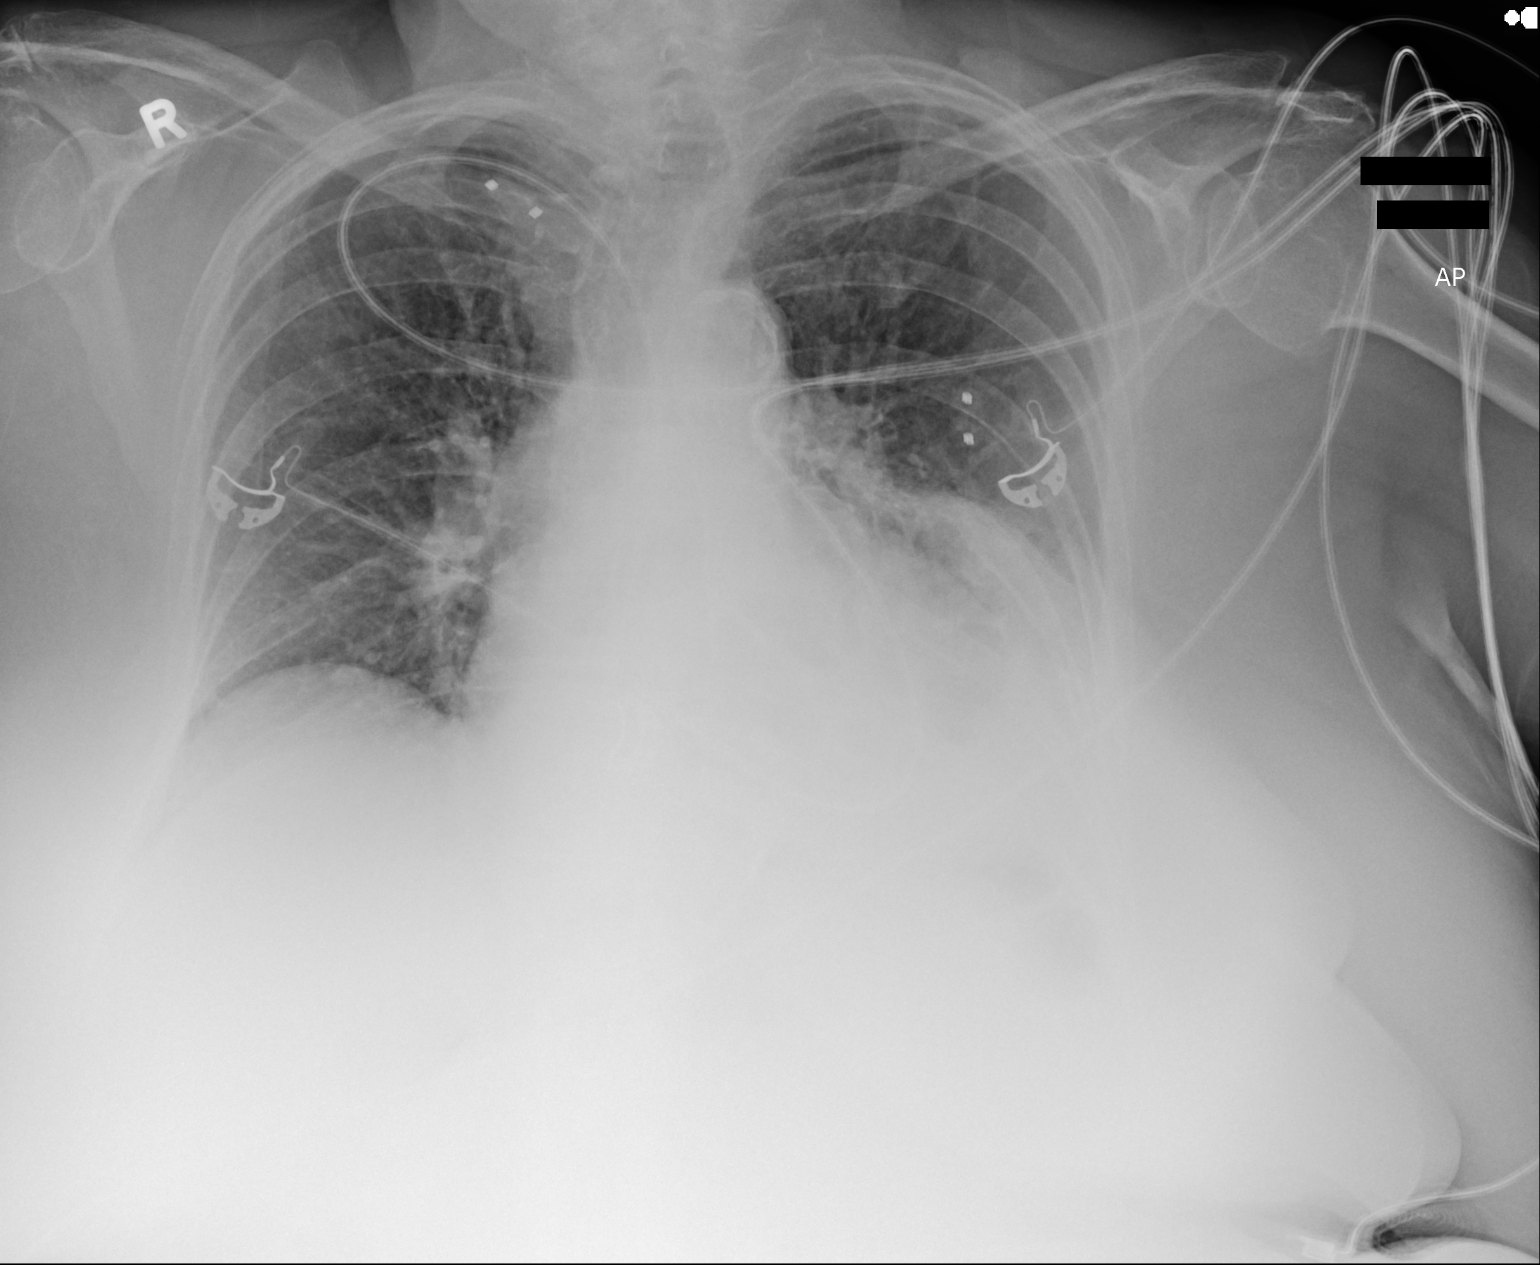

[1 of 1 positions shown; findings below may reference images not displayed]

FINDINGS: There is a normal heart size.  Lung volumes are low.
Very large hiatal hernia obscures much of the left heart border.
No airspace consolidation noted.
IMPRESSION: 1.  Very large hiatal hernia.
2.  Low lung volumes.

## 2015-01-15 IMAGING — CT CT ANGIO CHEST
1 of 6 series · 5 of 36 positions shown · IV contrast (omnipaque)
Comparison: Chest x-ray dated 07/29/2012 and CT scan of the chest
dated 09/25/2008

CLINICAL DATA: Shortness of breath.

CT ANGIOGRAPHY CHEST
TECHNIQUE: Multidetector CT imaging of the chest using the
standard protocol during bolus administration of intravenous
contrast. Multiplanar reconstructed images including MIPs were
obtained and reviewed to evaluate the vascular anatomy.
Contrast: 80mL OMNIPAQUE IOHEXOL 350 MG/ML SOLN

[Series 4: pe 3.0 b40f · axial · 0.59mm/px · z∈[-252,-78]mm · 5 of 88 slices shown]
[im 15/88  lung]
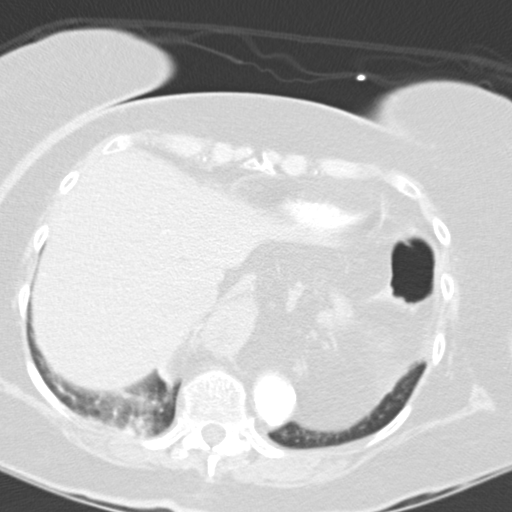
[im 30/88  mediastinal]
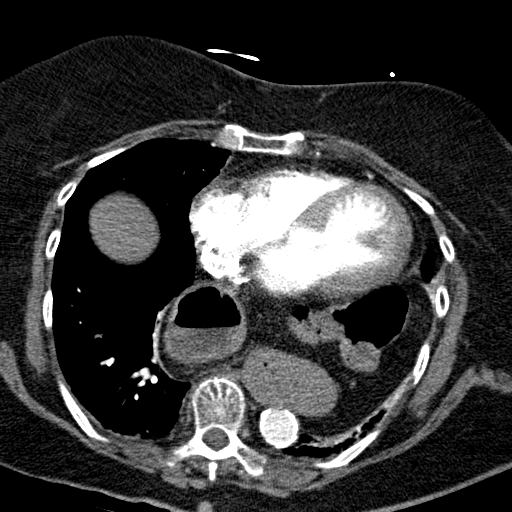
[im 44/88  lung]
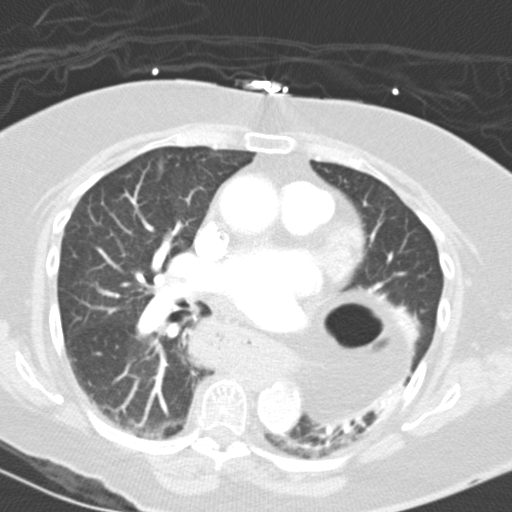
[im 59/88  mediastinal]
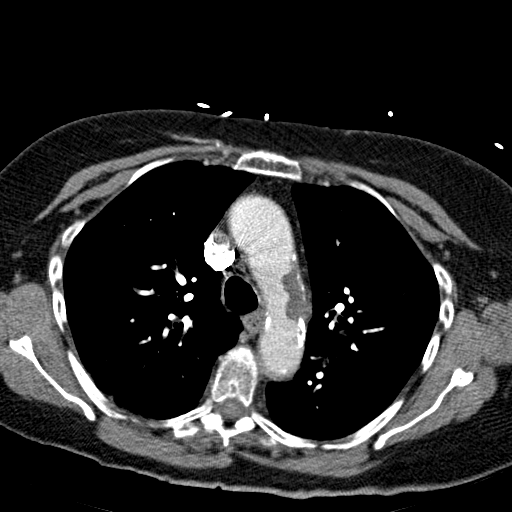
[im 73/88  lung]
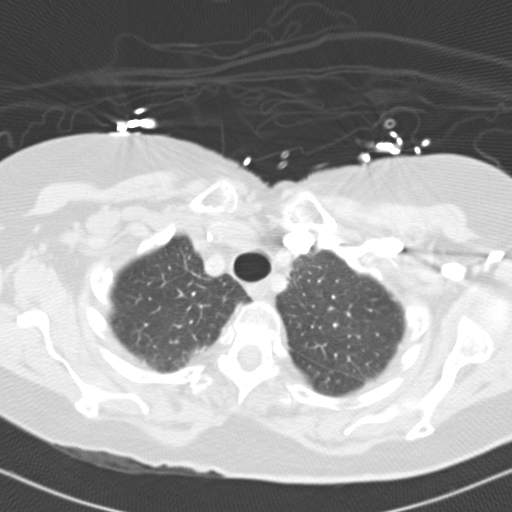

[5 of 36 positions shown; findings below may reference images not displayed]

FINDINGS: There are small pulmonary emboli in branches of the right
upper lobe pulmonary artery as well as a tiny embolus in one of the
distal right lower lobe.

There are no infiltrates or effusions. Chronic cardiomegaly.

Chronic huge hiatal hernia with chronic compressive atelectasis of
the left lung.  The hiatal hernia contains most of the stomach as
well as a loop of the transverse colon.  No acute osseous
abnormality.

Multiple areas of prominent atherosclerotic disease in the thoracic
aorta.
IMPRESSION: 1.  Small pulmonary emboli in the right lung.
2.  No other acute abnormality.
